# Patient Record
Sex: Female | Born: 2000 | Hispanic: No | Marital: Single | Smoking: Never smoker
Health system: Southern US, Community
[De-identification: ages and names within clinical notes are randomized; demographics above are authoritative.]

## PROBLEM LIST (undated history)

## (undated) DIAGNOSIS — E739 Lactose intolerance, unspecified: Secondary | ICD-10-CM

## (undated) DIAGNOSIS — Z9109 Other allergy status, other than to drugs and biological substances: Secondary | ICD-10-CM

## (undated) DIAGNOSIS — F909 Attention-deficit hyperactivity disorder, unspecified type: Secondary | ICD-10-CM

## (undated) DIAGNOSIS — N390 Urinary tract infection, site not specified: Secondary | ICD-10-CM

## (undated) DIAGNOSIS — J45909 Unspecified asthma, uncomplicated: Secondary | ICD-10-CM

## (undated) HISTORY — PX: TONSILLECTOMY AND ADENOIDECTOMY: SUR1326

## (undated) HISTORY — DX: Urinary tract infection, site not specified: N39.0

## (undated) HISTORY — PX: ADENOIDECTOMY: SUR15

---
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
## 2022-02-01 ENCOUNTER — Other Ambulatory Visit: Payer: Self-pay

## 2022-02-01 ENCOUNTER — Encounter (HOSPITAL_COMMUNITY): Payer: Self-pay | Admitting: Emergency Medicine

## 2022-02-01 ENCOUNTER — Ambulatory Visit (HOSPITAL_COMMUNITY)
Admission: EM | Admit: 2022-02-01 | Discharge: 2022-02-01 | Disposition: A | Discharge status: Home / Self Care | Payer: Medicaid Other | Attending: Nurse Practitioner | Admitting: Nurse Practitioner

## 2022-02-01 DIAGNOSIS — J069 Acute upper respiratory infection, unspecified: Secondary | ICD-10-CM

## 2022-02-01 DIAGNOSIS — Z889 Allergy status to unspecified drugs, medicaments and biological substances status: Secondary | ICD-10-CM | POA: Insufficient documentation

## 2022-02-01 DIAGNOSIS — Z8709 Personal history of other diseases of the respiratory system: Secondary | ICD-10-CM | POA: Diagnosis not present

## 2022-02-01 DIAGNOSIS — Z20822 Contact with and (suspected) exposure to covid-19: Secondary | ICD-10-CM | POA: Diagnosis not present

## 2022-02-01 DIAGNOSIS — R6884 Jaw pain: Secondary | ICD-10-CM | POA: Insufficient documentation

## 2022-02-01 DIAGNOSIS — J029 Acute pharyngitis, unspecified: Secondary | ICD-10-CM | POA: Insufficient documentation

## 2022-02-01 MED ORDER — ALBUTEROL SULFATE HFA 108 (90 BASE) MCG/ACT IN AERS: 1.0000 | INHALATION_SPRAY | Freq: Four times a day (QID) | RESPIRATORY_TRACT | 0 refills | Status: DC | PRN

## 2022-02-01 MED ORDER — CETIRIZINE HCL 10 MG PO TABS: 10.0000 mg | ORAL_TABLET | Freq: Every day | ORAL | 0 refills | Status: DC

## 2022-02-01 MED ORDER — FLUTICASONE PROPIONATE 50 MCG/ACT NA SUSP: 1.0000 | Freq: Every day | NASAL | 0 refills | Status: DC

## 2022-02-01 NOTE — ED Notes (Signed)
Covid swab in lab

## 2022-02-01 NOTE — Discharge Instructions (Addendum)
Please resume the cetirizine 10 mg daily and flonase nasal spray to help with your symptoms.  I have also sent a refill of your rescue inhaler to the pharmacy.  We will call you if the results of the COVID test are positive Monday - please stay home from work until we have these results.   ? ?Some things that can make you feel better are: ?- Increased rest ?- Increasing fluid with water/sugar free electrolytes ?- Acetaminophen as needed for fever/pain.  ?- Salt water gargling, chloraseptic spray and throat lozenges ?- OTC guaifenesin (Mucinex).  ?- Saline sinus flushes or a neti pot.  ?- Humidifying the air. ? ?

## 2022-02-01 NOTE — ED Triage Notes (Signed)
Sore throat started 3 days ago.  Pain has not been that bad until during the night last night and this morning.  Patient has a slight runny nose and a cough. ?

## 2022-02-01 NOTE — ED Provider Notes (Signed)
[facility]    CSN: [id number] Arrival date & time: 02/01/22  1246      History   Chief Complaint Chief Complaint  Janice Nicholson presents with   Sore Throat    HPI Janice Nicholson is a 21 y.o. female.   Janice Nicholson presents with mother for 3 day history of sore throat, congestion, cough, wheezing at times.  Janice Nicholson denies fevers, chills, body aches, shortness of breath, chest pain, headache, and nausea/vomiting, and diarrhea.  Denies ear pain or drainage. Does report nasal drainage and post nasal drip, watery eyes.  Janice Nicholson has not taken any medication so far for the symptoms.  Reports Janice Nicholson co worker is sick.  Also says Janice Nicholson wisdom teeth are coming in and causing Janice Nicholson pain in Janice Nicholson right jaw.    Mother reports Janice Nicholson is trying to get Janice Nicholson in with a new primary care provider.  Janice Nicholson has run out of Janice Nicholson allergy and asthma medication and they are requesting refills today.     Past Medical History:  Diagnosis Date   Asthma    Attention deficit hyperactivity disorder (ADHD)    Environmental allergies    Lactose intolerance     Janice Nicholson Active Problem List   Diagnosis Date Noted   BMI (body mass index), pediatric, 95-99% for age 57/28/2020   Moderate persistent asthma without complication 08/22/2019   Primary insomnia 08/22/2019   ADHD (attention deficit hyperactivity disorder), combined type 03/10/2016   Generalized anxiety disorder 03/10/2016   Adjustment disorder with depressed mood 03/10/2016    Past Surgical History:  Procedure Laterality Date   ADENOIDECTOMY     TONSILLECTOMY AND ADENOIDECTOMY      OB History   No obstetric history on file.      Home Medications    Prior to Admission medications   Medication Sig Start Date End Date Taking? Authorizing Provider  fluticasone (FLONASE) 50 MCG/ACT nasal spray Place 1 spray into both nostrils daily. 02/01/22  Yes Valentino NoseMartinez, August Longest A, NP  albuterol (VENTOLIN HFA) 108 (90 Base) MCG/ACT inhaler Inhale 1 puff into the lungs every 6  (six) hours as needed for wheezing or shortness of breath. 02/01/22   Valentino NoseMartinez, Nashonda Limberg A, NP  Amphetamine Sulfate (EVEKEO ODT) 20 MG TBDP Take 20 mg by mouth daily. Janice Nicholson not taking: Reported on 02/01/2022 10/16/20   Paretta-Leahey, Miachel Rouxawn M, NP  cetirizine (ZYRTEC) 10 MG tablet Take 1 tablet (10 mg total) by mouth daily. 02/01/22 03/03/22  Valentino NoseMartinez, Yuvraj Pfeifer A, NP  cloNIDine (CATAPRES) 0.3 MG tablet TAKE 1 TABLET(0.3 MG) BY MOUTH AT BEDTIME Janice Nicholson not taking: Reported on 02/01/2022 01/02/20   Paretta-Leahey, Miachel Rouxawn M, NP  Fluticasone-Salmeterol (ADVAIR) 250-50 MCG/DOSE AEPB Inhale into the lungs. Janice Nicholson not taking: Reported on 06/15/2020 08/22/19   [provider]  triamcinolone cream (KENALOG) 0.1 % APPLY TO EZCEMA ON ARMS TWICE A DAY Janice Nicholson not taking: Reported on 06/15/2020 03/17/18   [provider]    Family History History reviewed. No pertinent family history.  Social History Social History   Tobacco Use   Smoking status: Never    Passive exposure: Yes   Smokeless tobacco: Never  Vaping Use   Vaping Use: Never used  Substance Use Topics   Alcohol use: Yes   Drug use: Never     Allergies   Lactose intolerance (gi)   Review of Systems Review of Systems Per HPI  Physical Exam Triage Vital Signs ED Triage Vitals  Enc Vitals Group     BP 02/01/22 1307 121/82  Pulse Rate 02/01/22 1307 73     Resp 02/01/22 1307 18     Temp 02/01/22 1307 99.1 F (37.3 C)     Temp Source 02/01/22 1307 Oral     SpO2 02/01/22 1307 98 %     Weight --      Height --      Head Circumference --      Peak Flow --      Pain Score 02/01/22 1302 4     Pain Loc --      Pain Edu? --      Excl. in GC? --    No data found.  Updated Vital Signs BP 121/82 (BP Location: Left Arm)    Pulse 73    Temp 99.1 F (37.3 C) (Oral)    Resp 18    SpO2 98%   Visual Acuity Right Eye Distance:   Left Eye Distance:   Bilateral Distance:    Right Eye Near:   Left Eye Near:     Bilateral Near:     Physical Exam Vitals and nursing note reviewed.  Constitutional:      General: Janice Nicholson is not in acute distress.    Appearance: Janice Nicholson is well-developed. Janice Nicholson is obese. Janice Nicholson is not toxic-appearing.  HENT:     Head: Normocephalic and atraumatic.     Right Ear: Tympanic membrane and ear canal normal. No drainage, swelling or tenderness. No middle ear effusion.     Left Ear: Tympanic membrane and ear canal normal. No drainage, swelling or tenderness.  No middle ear effusion.     Nose: Congestion present. No rhinorrhea.     Mouth/Throat:     Mouth: Mucous membranes are moist.     Pharynx: Uvula midline. Posterior oropharyngeal erythema present. No pharyngeal swelling, oropharyngeal exudate or uvula swelling.     Tonsils: No tonsillar exudate or tonsillar abscesses. 0 on the right. 0 on the left.  Eyes:     Extraocular Movements:     Right eye: Normal extraocular motion.     Left eye: Normal extraocular motion.     Conjunctiva/sclera: Conjunctivae normal.  Cardiovascular:     Rate and Rhythm: Normal rate and regular rhythm.  Pulmonary:     Effort: Pulmonary effort is normal. No respiratory distress.     Breath sounds: Normal breath sounds. No wheezing, rhonchi or rales.  Musculoskeletal:     Cervical back: Normal range of motion and neck supple.  Lymphadenopathy:     Cervical: Cervical adenopathy present.  Skin:    General: Skin is warm and dry.     Capillary Refill: Capillary refill takes less than 2 seconds.     Coloration: Skin is not pale.     Findings: No erythema or rash.  Neurological:     Mental Status: Janice Nicholson is alert and oriented to person, place, and time.     UC Treatments / Results  Labs (all labs ordered are listed, but only abnormal results are displayed) Labs Reviewed  SARS CORONAVIRUS 2 (TAT 6-24 HRS)    EKG   Radiology No results found.  Procedures Procedures (including critical care time)  Medications Ordered in UC Medications - No data  to display  Initial Impression / Assessment and Plan / UC Course  I have reviewed the triage vital signs and the nursing notes.  Pertinent labs & imaging results that were available during my care of the Janice Nicholson were reviewed by me and considered in my medical decision making (see chart  for details).    Obtain COVID-19 testing, we will notify with positive results and Janice Nicholson should remain out of work at least until Monday.  Reassured Janice Nicholson and mother that symptoms and exam findings are most consistent with a viral upper respiratory infection and explained lack of efficacy of antibiotics against viruses.  Discussed expected course and features suggestive of secondary bacterial infection.  Continue supportive care. Increase fluid intake with water or electrolyte solution like pedialyte. Encouraged acetaminophen as needed for fever/pain. Encouraged salt water gargling, chloraseptic spray and throat lozenges. Encouraged OTC guaifenesin. Encouraged saline sinus flushes and/or neti with humidified air.   Refills given for both seasonal allergic rhinitis and history of asthma.  Resume cetirizine 10 mg daily and flonase 1 spray each nostril daily.  Use albuterol 1 puff every 6 hours as needed for wheezing.  No wheezing appreciated on examination today.  Encouraged establishing care with primary care provider for future refills.   Final Clinical Impressions(s) / UC Diagnoses   Final diagnoses:  Acute pharyngitis, unspecified etiology  History of seasonal allergies  History of asthma     Discharge Instructions      Please resume the cetirizine 10 mg daily and flonase nasal spray to help with your symptoms.  I have also sent a refill of your rescue inhaler to the pharmacy.  We will call you if the results of the COVID test are positive Monday - please stay home from work until we have these results.    Some things that can make you feel better are: - Increased rest - Increasing fluid with water/sugar  free electrolytes - Acetaminophen as needed for fever/pain.  - Salt water gargling, chloraseptic spray and throat lozenges - OTC guaifenesin (Mucinex).  - Saline sinus flushes or a neti pot.  - Humidifying the air.    ED Prescriptions     Medication Sig Dispense Auth. Provider   cetirizine (ZYRTEC) 10 MG tablet Take 1 tablet (10 mg total) by mouth daily. 30 tablet Cathlean Marseilles A, NP   albuterol (VENTOLIN HFA) 108 (90 Base) MCG/ACT inhaler Inhale 1 puff into the lungs every 6 (six) hours as needed for wheezing or shortness of breath. 18 g Cathlean Marseilles A, NP   fluticasone (FLONASE) 50 MCG/ACT nasal spray Place 1 spray into both nostrils daily. 16 g Valentino Nose, NP      PDMP not reviewed this encounter.   Valentino Nose, NP 02/01/22 1436

## 2022-02-02 LAB — SARS CORONAVIRUS 2 (TAT 6-24 HRS): SARS Coronavirus 2: NEGATIVE

## 2022-05-12 ENCOUNTER — Ambulatory Visit
Admission: EM | Admit: 2022-05-12 | Discharge: 2022-05-12 | Disposition: A | Discharge status: Home / Self Care | Payer: Medicaid Other | Attending: Internal Medicine | Admitting: Internal Medicine

## 2022-05-12 DIAGNOSIS — J4521 Mild intermittent asthma with (acute) exacerbation: Secondary | ICD-10-CM | POA: Diagnosis present

## 2022-05-12 MED ORDER — ALBUTEROL SULFATE HFA 108 (90 BASE) MCG/ACT IN AERS: 1.0000 | INHALATION_SPRAY | Freq: Four times a day (QID) | RESPIRATORY_TRACT | 0 refills | Status: DC | PRN

## 2022-05-12 MED ORDER — PHENOL 1.4 % MT LIQD: 1.0000 | OROMUCOSAL | 0 refills | Status: DC | PRN
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
## 2022-05-12 NOTE — Discharge Instructions (Addendum)
Salt water gargle Chloraseptic throat spray as needed for throat pain Use medications as prescribed Return to urgent care if symptoms worsen We will call you with recommendations if labs are abnormal.

## 2022-05-12 NOTE — ED Triage Notes (Signed)
Pt c/o numbness in right arm states right hand has been numb x 1 week. States it may be associated w/ her wisdom teeth. Something prompted patient to place fingers in throat, states can feel a bump.   Needs inhaler refill.

## 2022-05-13 LAB — CYTOLOGY, (ORAL, ANAL, URETHRAL) ANCILLARY ONLY
Chlamydia: NEGATIVE
Comment: NEGATIVE
Comment: NEGATIVE
Comment: NORMAL
Neisseria Gonorrhea: POSITIVE — AB
Trichomonas: NEGATIVE

## 2022-05-14 ENCOUNTER — Ambulatory Visit
Admission: EM | Admit: 2022-05-14 | Discharge: 2022-05-14 | Disposition: A | Discharge status: Home / Self Care | Payer: Medicaid Other

## 2022-05-14 ENCOUNTER — Ambulatory Visit
Admission: EM | Admit: 2022-05-14 | Discharge: 2022-05-14 | Disposition: A | Discharge status: Home / Self Care | Payer: Medicaid Other | Attending: Internal Medicine | Admitting: Internal Medicine

## 2022-05-14 ENCOUNTER — Telehealth (HOSPITAL_COMMUNITY): Payer: Self-pay | Admitting: Emergency Medicine

## 2022-05-14 DIAGNOSIS — A549 Gonococcal infection, unspecified: Secondary | ICD-10-CM | POA: Diagnosis not present

## 2022-05-14 MED ORDER — CEFTRIAXONE SODIUM 500 MG IJ SOLR
500.0000 mg | Freq: Once | INTRAMUSCULAR | Status: AC
  Administered 2022-05-14: 500 mg via INTRAMUSCULAR

## 2022-05-14 NOTE — Telephone Encounter (Signed)
PEr protocol, patient will need treatment with IM ROcephin 500mg  for positive Gonorrhea of the throat.  WIll also need to return for test of cure in 1 week.   Contacted patient by phone.  Verified identity using two identifiers.  Provided positive result.  Reviewed safe sex practices, notifying partners, and refraining from sexual activities for 7 days from time of treatment.  Patient verified understanding, all questions answered.   [facility] notified

## 2022-05-14 NOTE — ED Notes (Signed)
Staff went to call patient name in the lobby and she did not answer, staff called patient Via phone and patient stated she had left and it was fine.

## 2022-05-14 NOTE — ED Triage Notes (Signed)
Pt came in to be treated for positive Gonorrhea.

## 2022-05-16 ENCOUNTER — Ambulatory Visit
Admission: EM | Admit: 2022-05-16 | Discharge: 2022-05-16 | Disposition: A | Discharge status: Home / Self Care | Payer: Medicaid Other | Attending: Urgent Care | Admitting: Urgent Care

## 2022-05-16 DIAGNOSIS — Z202 Contact with and (suspected) exposure to infections with a predominantly sexual mode of transmission: Secondary | ICD-10-CM | POA: Insufficient documentation

## 2022-05-16 LAB — POCT URINE PREGNANCY: Preg Test, Ur: NEGATIVE

## 2022-05-16 MED ORDER — METRONIDAZOLE 500 MG PO TABS: 500.0000 mg | ORAL_TABLET | Freq: Two times a day (BID) | ORAL | 0 refills | Status: AC

## 2022-05-16 NOTE — ED Triage Notes (Signed)
Pt presents today for treatment of tric. Pt reports she came in for sore throat which was gonorrhea. Pt reports she received shot for this but so also tested pos for tric. Pt reports unknown last known period. Pt also reports numbness in right up non stop for 3 days.

## 2022-05-19 LAB — MOLECULAR ANCILLARY ONLY
Bacterial Vaginitis (gardnerella): NEGATIVE
Candida Glabrata: NEGATIVE
Candida Vaginitis: NEGATIVE
Chlamydia: NEGATIVE
Comment: NEGATIVE
Comment: NEGATIVE
Comment: NEGATIVE
Comment: NEGATIVE
Comment: NEGATIVE
Comment: NORMAL
Neisseria Gonorrhea: NEGATIVE
Trichomonas: NEGATIVE

## 2022-07-21 ENCOUNTER — Ambulatory Visit
Admission: EM | Admit: 2022-07-21 | Discharge: 2022-07-21 | Disposition: A | Discharge status: Home / Self Care | Payer: Medicaid Other | Attending: Physician Assistant | Admitting: Physician Assistant

## 2022-07-21 DIAGNOSIS — R1012 Left upper quadrant pain: Secondary | ICD-10-CM

## 2022-07-21 DIAGNOSIS — R1013 Epigastric pain: Secondary | ICD-10-CM

## 2022-07-21 LAB — POCT URINALYSIS DIP (MANUAL ENTRY)
Bilirubin, UA: NEGATIVE
Blood, UA: NEGATIVE
Glucose, UA: NEGATIVE mg/dL
Ketones, POC UA: NEGATIVE mg/dL
Leukocytes, UA: NEGATIVE
Nitrite, UA: NEGATIVE
Spec Grav, UA: 1.025 (ref 1.010–1.025)
Urobilinogen, UA: 0.2 E.U./dL
pH, UA: 7.5 (ref 5.0–8.0)

## 2022-07-21 LAB — POCT URINE PREGNANCY: Preg Test, Ur: NEGATIVE

## 2022-07-21 MED ORDER — DICYCLOMINE HCL 20 MG PO TABS: 20.0000 mg | ORAL_TABLET | Freq: Two times a day (BID) | ORAL | 0 refills | Status: DC

## 2022-07-21 NOTE — ED Provider Notes (Signed)
[facility]    CSN: [id number] Arrival date & time: 07/21/22  1242      History   Chief Complaint Chief Complaint  Patient presents with   left back pain    HPI Janice Nicholson is a 21 y.o. female.   Patient here today for evaluation of epigastric abdominal pain/left upper quadrant abdominal pain that radiates to her back.  She reports that this has been ongoing but seems to be worse recently.  Eating certain foods seems to worsen pain but she has not been able to determine what triggers more intense pain.  She has had some associated nausea, vomiting and diarrhea.  She reports some "heat strokes" while she is vomiting at times.  She denies any known fever.  She denies any blood in her stool or dark tarry stools.  She does not report treatment for symptoms.  The history is provided by the patient.    Past Medical History:  Diagnosis Date   Asthma    Attention deficit hyperactivity disorder (ADHD)    Environmental allergies    Lactose intolerance     Patient Active Problem List   Diagnosis Date Noted   BMI (body mass index), pediatric, 95-99% for age 48/28/2020   Moderate persistent asthma without complication 08/22/2019   Primary insomnia 08/22/2019   ADHD (attention deficit hyperactivity disorder), combined type 03/10/2016   Generalized anxiety disorder 03/10/2016   Adjustment disorder with depressed mood 03/10/2016    Past Surgical History:  Procedure Laterality Date   ADENOIDECTOMY     TONSILLECTOMY AND ADENOIDECTOMY      OB History   No obstetric history on file.      Home Medications    Prior to Admission medications   Medication Sig Start Date End Date Taking? Authorizing Provider  dicyclomine (BENTYL) 20 MG tablet Take 1 tablet (20 mg total) by mouth 2 (two) times daily. 07/21/22  Yes Tomi Bamberger, PA-C  albuterol (VENTOLIN HFA) 108 (90 Base) MCG/ACT inhaler Inhale 1 puff into the lungs every 6 (six) hours as needed for wheezing or  shortness of breath. 05/12/22   Lamptey, Britta Mccreedy, MD  cetirizine (ZYRTEC) 10 MG tablet Take 1 tablet (10 mg total) by mouth daily. 02/01/22 03/03/22  Valentino Nose, NP  fluticasone (FLONASE) 50 MCG/ACT nasal spray Place 1 spray into both nostrils daily. 02/01/22   Valentino Nose, NP  phenol (CHLORASEPTIC) 1.4 % LIQD Use as directed 1 spray in the mouth or throat as needed for throat irritation / pain. 05/12/22   Lamptey, Britta Mccreedy, MD    Family History History reviewed. No pertinent family history.  Social History Social History   Tobacco Use   Smoking status: Never    Passive exposure: Yes   Smokeless tobacco: Never  Vaping Use   Vaping Use: Never used  Substance Use Topics   Alcohol use: Yes   Drug use: Never     Allergies   Lactose intolerance (gi)   Review of Systems Review of Systems  Constitutional:  Negative for chills and fever.  Eyes:  Negative for discharge and redness.  Respiratory:  Negative for shortness of breath.   Gastrointestinal:  Positive for abdominal pain, diarrhea, nausea and vomiting. Negative for blood in stool.     Physical Exam Triage Vital Signs ED Triage Vitals  Enc Vitals Group     BP 07/21/22 1455 135/85     Pulse Rate 07/21/22 1455 83     Resp 07/21/22 1455 18  Temp 07/21/22 1455 98.3 F (36.8 C)     Temp Source 07/21/22 1455 Oral     SpO2 07/21/22 1455 99 %     Weight --      Height --      Head Circumference --      Peak Flow --      Pain Score 07/21/22 1456 8     Pain Loc --      Pain Edu? --      Excl. in GC? --    No data found.  Updated Vital Signs BP 135/85 (BP Location: Right Arm)   Pulse 83   Temp 98.3 F (36.8 C) (Oral)   Resp 18   LMP 06/07/2022 (Approximate)   SpO2 99%      Physical Exam Vitals and nursing note reviewed.  Constitutional:      General: She is not in acute distress.    Appearance: Normal appearance. She is not ill-appearing.  HENT:     Head: Normocephalic and atraumatic.   Eyes:     Conjunctiva/sclera: Conjunctivae normal.  Cardiovascular:     Rate and Rhythm: Normal rate and regular rhythm.     Heart sounds: Normal heart sounds.  Pulmonary:     Effort: Pulmonary effort is normal. No respiratory distress.     Breath sounds: Normal breath sounds. No wheezing, rhonchi or rales.  Abdominal:     General: Abdomen is flat. Bowel sounds are normal. There is no distension.     Tenderness: There is abdominal tenderness (mild epigastric and LUQ TTP). There is no guarding or rebound.  Neurological:     Mental Status: She is alert.  Psychiatric:        Mood and Affect: Mood normal.        Behavior: Behavior normal.        Thought Content: Thought content normal.      UC Treatments / Results  Labs (all labs ordered are listed, but only abnormal results are displayed) Labs Reviewed  POCT URINALYSIS DIP (MANUAL ENTRY) - Abnormal; Notable for the following components:      Result Value   Color, UA orange (*)    Protein Ur, POC trace (*)    All other components within normal limits  CBC WITH DIFFERENTIAL/PLATELET  COMPREHENSIVE METABOLIC PANEL  AMYLASE  LIPASE  H. PYLORI ANTIBODY, IGG  POCT URINE PREGNANCY    EKG   Radiology No results found.  Procedures Procedures (including critical care time)  Medications Ordered in UC Medications - No data to display  Initial Impression / Assessment and Plan / UC Course  I have reviewed the triage vital signs and the nursing notes.  Pertinent labs & imaging results that were available during my care of the patient were reviewed by me and considered in my medical decision making (see chart for details).    Routine labs ordered.  Differential includes H. pylori, gastric ulcers, pancreatitis.  Low suspicion for kidney stone given UA results.  Recommended further evaluation in the emergency room with any further concerns and discussed that she may need to see gastroenterologist for further evaluation as well.   Will trial dicyclomine for abdominal pain while awaiting results.  Final Clinical Impressions(s) / UC Diagnoses   Final diagnoses:  LUQ pain  Abdominal pain, epigastric   Discharge Instructions   None    ED Prescriptions     Medication Sig Dispense Auth. Provider   dicyclomine (BENTYL) 20 MG tablet Take 1 tablet (20 mg  total) by mouth 2 (two) times daily. 20 tablet Tomi Bamberger, PA-C      PDMP not reviewed this encounter.   Tomi Bamberger, PA-C 07/21/22 [phone]

## 2022-07-21 NOTE — ED Triage Notes (Signed)
Pt c/o sharp left back pain onset ~ 1 week ago. Associates it with something she was eating that caused her to vomit. Reports pain radiates around left side to left stomach.

## 2022-07-22 ENCOUNTER — Other Ambulatory Visit: Payer: Self-pay

## 2022-07-22 ENCOUNTER — Telehealth: Payer: Self-pay | Admitting: Physician Assistant

## 2022-07-22 ENCOUNTER — Emergency Department (HOSPITAL_COMMUNITY): Payer: Medicaid Other

## 2022-07-22 ENCOUNTER — Encounter (HOSPITAL_COMMUNITY): Payer: Self-pay

## 2022-07-22 ENCOUNTER — Inpatient Hospital Stay (HOSPITAL_COMMUNITY)
Admission: EM | Admit: 2022-07-22 | Discharge: 2022-07-25 | DRG: 418 | Disposition: A | Discharge status: Home / Self Care | Payer: Medicaid Other | Attending: Surgery | Admitting: Surgery

## 2022-07-22 DIAGNOSIS — K851 Biliary acute pancreatitis without necrosis or infection: Secondary | ICD-10-CM

## 2022-07-22 DIAGNOSIS — Z888 Allergy status to other drugs, medicaments and biological substances status: Secondary | ICD-10-CM

## 2022-07-22 DIAGNOSIS — E739 Lactose intolerance, unspecified: Secondary | ICD-10-CM | POA: Diagnosis present

## 2022-07-22 DIAGNOSIS — Z91012 Allergy to eggs: Secondary | ICD-10-CM

## 2022-07-22 DIAGNOSIS — Z6838 Body mass index (BMI) 38.0-38.9, adult: Secondary | ICD-10-CM

## 2022-07-22 DIAGNOSIS — Z79899 Other long term (current) drug therapy: Secondary | ICD-10-CM

## 2022-07-22 DIAGNOSIS — K8010 Calculus of gallbladder with chronic cholecystitis without obstruction: Secondary | ICD-10-CM | POA: Diagnosis present

## 2022-07-22 DIAGNOSIS — K8020 Calculus of gallbladder without cholecystitis without obstruction: Secondary | ICD-10-CM | POA: Diagnosis present

## 2022-07-22 DIAGNOSIS — F909 Attention-deficit hyperactivity disorder, unspecified type: Secondary | ICD-10-CM | POA: Diagnosis present

## 2022-07-22 DIAGNOSIS — R1010 Upper abdominal pain, unspecified: Principal | ICD-10-CM

## 2022-07-22 DIAGNOSIS — Z880 Allergy status to penicillin: Secondary | ICD-10-CM

## 2022-07-22 DIAGNOSIS — J45909 Unspecified asthma, uncomplicated: Secondary | ICD-10-CM | POA: Diagnosis present

## 2022-07-22 DIAGNOSIS — E669 Obesity, unspecified: Secondary | ICD-10-CM | POA: Diagnosis present

## 2022-07-22 HISTORY — DX: Biliary acute pancreatitis without necrosis or infection: K85.10

## 2022-07-22 LAB — COMPREHENSIVE METABOLIC PANEL
ALT: 40 IU/L — ABNORMAL HIGH (ref 0–32)
ALT: 34 U/L (ref 0–44)
AST: 126 IU/L — ABNORMAL HIGH (ref 0–40)
AST: 40 U/L (ref 15–41)
Albumin/Globulin Ratio: 1.5 (ref 1.2–2.2)
Albumin: 4.3 g/dL (ref 4.0–5.0)
Albumin: 4.1 g/dL (ref 3.5–5.0)
Alkaline Phosphatase: 109 IU/L — ABNORMAL HIGH (ref 42–106)
Alkaline Phosphatase: 85 U/L (ref 38–126)
Anion gap: 6 (ref 5–15)
BUN/Creatinine Ratio: 15 (ref 9–23)
BUN: 10 mg/dL (ref 6–20)
BUN: 9 mg/dL (ref 6–20)
Bilirubin Total: 0.5 mg/dL (ref 0.0–1.2)
CO2: 22 mmol/L (ref 20–29)
CO2: 26 mmol/L (ref 22–32)
Calcium: 9.5 mg/dL (ref 8.7–10.2)
Calcium: 9.2 mg/dL (ref 8.9–10.3)
Chloride: 103 mmol/L (ref 96–106)
Chloride: 106 mmol/L (ref 98–111)
Creatinine, Ser: 0.65 mg/dL (ref 0.57–1.00)
Creatinine, Ser: 0.53 mg/dL (ref 0.44–1.00)
GFR, Estimated: >60 mL/min (ref >60)
Globulin, Total: 2.9 g/dL (ref 1.5–4.5)
Glucose, Bld: 96 mg/dL (ref 70–99)
Glucose: 88 mg/dL (ref 70–99)
Potassium: 4.3 mmol/L (ref 3.5–5.2)
Potassium: 3.9 mmol/L (ref 3.5–5.1)
Sodium: 138 mmol/L (ref 134–144)
Sodium: 138 mmol/L (ref 135–145)
Total Bilirubin: 0.5 mg/dL (ref 0.3–1.2)
Total Protein: 7.2 g/dL (ref 6.0–8.5)
Total Protein: 7.7 g/dL (ref 6.5–8.1)
eGFR: 131 mL/min/{1.73_m2} (ref >59)

## 2022-07-22 LAB — URINALYSIS, ROUTINE W REFLEX MICROSCOPIC
Bilirubin Urine: NEGATIVE
Glucose, UA: NEGATIVE mg/dL
Hgb urine dipstick: NEGATIVE
Ketones, ur: NEGATIVE mg/dL
Leukocytes,Ua: NEGATIVE
Nitrite: NEGATIVE
Protein, ur: NEGATIVE mg/dL
Specific Gravity, Urine: 1.02 (ref 1.005–1.030)
pH: 5 (ref 5.0–8.0)

## 2022-07-22 LAB — CBC WITH DIFFERENTIAL/PLATELET
Abs Immature Granulocytes: 0.06 10*3/uL (ref 0.00–0.07)
Basophils Absolute: 0.1 10*3/uL (ref 0.0–0.2)
Basophils Absolute: 0.1 10*3/uL (ref 0.0–0.1)
Basophils Relative: 1 %
Basos: 0 %
EOS (ABSOLUTE): 0 10*3/uL (ref 0.0–0.4)
Eos: 0 %
Eosinophils Absolute: 0.1 10*3/uL (ref 0.0–0.5)
Eosinophils Relative: 1 %
HCT: 41.1 % (ref 36.0–46.0)
Hematocrit: 38.6 % (ref 34.0–46.6)
Hemoglobin: 12.6 g/dL (ref 11.1–15.9)
Hemoglobin: 13 g/dL (ref 12.0–15.0)
Immature Grans (Abs): 0 10*3/uL (ref 0.0–0.1)
Immature Granulocytes: 0 %
Immature Granulocytes: 1 %
Lymphocytes Absolute: 3.1 10*3/uL (ref 0.7–3.1)
Lymphocytes Relative: 36 %
Lymphs Abs: 3.8 10*3/uL (ref 0.7–4.0)
Lymphs: 22 %
MCH: 26.1 pg — ABNORMAL LOW (ref 26.6–33.0)
MCH: 26.1 pg (ref 26.0–34.0)
MCHC: 32.6 g/dL (ref 31.5–35.7)
MCHC: 31.6 g/dL (ref 30.0–36.0)
MCV: 80 fL (ref 79–97)
MCV: 82.5 fL (ref 80.0–100.0)
Monocytes Absolute: 1.1 10*3/uL — ABNORMAL HIGH (ref 0.1–0.9)
Monocytes Absolute: 0.8 10*3/uL (ref 0.1–1.0)
Monocytes Relative: 7 %
Monocytes: 8 %
Neutro Abs: 5.9 10*3/uL (ref 1.7–7.7)
Neutrophils Absolute: 9.8 10*3/uL — ABNORMAL HIGH (ref 1.4–7.0)
Neutrophils Relative %: 54 %
Neutrophils: 70 %
Platelets: 441 10*3/uL (ref 150–450)
Platelets: 425 10*3/uL — ABNORMAL HIGH (ref 150–400)
RBC: 4.82 x10E6/uL (ref 3.77–5.28)
RBC: 4.98 MIL/uL (ref 3.87–5.11)
RDW: 13.8 % (ref 11.7–15.4)
RDW: 13.8 % (ref 11.5–15.5)
WBC: 14.1 10*3/uL — ABNORMAL HIGH (ref 3.4–10.8)
WBC: 10.6 10*3/uL — ABNORMAL HIGH (ref 4.0–10.5)
nRBC: 0 % (ref 0.0–0.2)

## 2022-07-22 LAB — LIPASE, BLOOD: Lipase: 50 U/L (ref 11–51)

## 2022-07-22 LAB — H. PYLORI ANTIBODY, IGG: H. pylori, IgG AbS: 0.4 Index Value (ref 0.00–0.79)

## 2022-07-22 LAB — LIPASE: Lipase: 3384 U/L — ABNORMAL HIGH (ref 14–72)

## 2022-07-22 LAB — TRIGLYCERIDES: Triglycerides: 92 mg/dL (ref <150)

## 2022-07-22 LAB — AMYLASE: Amylase: 574 U/L (ref 31–110)

## 2022-07-22 MED ORDER — MELATONIN 3 MG PO TABS
3.0000 mg | ORAL_TABLET | Freq: Every evening | ORAL | Status: DC | PRN
  Administered 2022-07-24: 3 mg via ORAL
  Filled 2022-07-22: qty 1

## 2022-07-22 MED ORDER — DIPHENHYDRAMINE HCL 12.5 MG/5ML PO ELIX
12.5000 mg | ORAL_SOLUTION | Freq: Four times a day (QID) | ORAL | Status: DC | PRN
  Administered 2022-07-23 (×2): 12.5 mg via ORAL
  Filled 2022-07-22 (×2): qty 5

## 2022-07-22 MED ORDER — ONDANSETRON HCL 4 MG/2ML IJ SOLN
4.0000 mg | Freq: Four times a day (QID) | INTRAMUSCULAR | Status: DC | PRN
  Administered 2022-07-25: 4 mg via INTRAVENOUS
  Filled 2022-07-22: qty 2

## 2022-07-22 MED ORDER — IBUPROFEN 400 MG PO TABS: 600.0000 mg | ORAL_TABLET | Freq: Four times a day (QID) | ORAL | Status: DC | PRN

## 2022-07-22 MED ORDER — METHOCARBAMOL 1000 MG/10ML IJ SOLN: 500.0000 mg | Freq: Three times a day (TID) | INTRAVENOUS | Status: DC | PRN

## 2022-07-22 MED ORDER — MORPHINE SULFATE (PF) 2 MG/ML IV SOLN
2.0000 mg | Freq: Once | INTRAVENOUS | Status: AC
  Administered 2022-07-23: 2 mg via INTRAVENOUS
  Filled 2022-07-22: qty 1

## 2022-07-22 MED ORDER — OXYCODONE HCL 5 MG PO TABS: 5.0000 mg | ORAL_TABLET | ORAL | Status: DC | PRN

## 2022-07-22 MED ORDER — ONDANSETRON 4 MG PO TBDP
4.0000 mg | ORAL_TABLET | Freq: Four times a day (QID) | ORAL | Status: DC | PRN
  Administered 2022-07-23: 4 mg via ORAL
  Filled 2022-07-22: qty 1

## 2022-07-22 MED ORDER — MORPHINE SULFATE (PF) 2 MG/ML IV SOLN
1.0000 mg | INTRAVENOUS | Status: DC | PRN
  Administered 2022-07-23 (×3): 2 mg via INTRAVENOUS
  Filled 2022-07-22 (×3): qty 1

## 2022-07-22 MED ORDER — DEXTROSE IN LACTATED RINGERS 5 % IV SOLN: INTRAVENOUS | Status: DC

## 2022-07-22 MED ORDER — CIPROFLOXACIN IN D5W 400 MG/200ML IV SOLN
400.0000 mg | Freq: Two times a day (BID) | INTRAVENOUS | Status: DC
  Administered 2022-07-23 (×2): 400 mg via INTRAVENOUS
  Filled 2022-07-22 (×2): qty 200

## 2022-07-22 MED ORDER — PROCHLORPERAZINE MALEATE 10 MG PO TABS: 10.0000 mg | ORAL_TABLET | Freq: Four times a day (QID) | ORAL | Status: DC | PRN

## 2022-07-22 MED ORDER — ACETAMINOPHEN 325 MG PO TABS: 325.0000 mg | ORAL_TABLET | Freq: Four times a day (QID) | ORAL | Status: DC | PRN

## 2022-07-22 MED ORDER — METHOCARBAMOL 500 MG PO TABS: 500.0000 mg | ORAL_TABLET | Freq: Three times a day (TID) | ORAL | Status: DC | PRN

## 2022-07-22 MED ORDER — LORATADINE 10 MG PO TABS
10.0000 mg | ORAL_TABLET | Freq: Every day | ORAL | Status: DC
  Administered 2022-07-24 – 2022-07-25 (×2): 10 mg via ORAL
  Filled 2022-07-22 (×2): qty 1

## 2022-07-22 MED ORDER — PROCHLORPERAZINE EDISYLATE 10 MG/2ML IJ SOLN
5.0000 mg | Freq: Four times a day (QID) | INTRAMUSCULAR | Status: DC | PRN
  Administered 2022-07-23: 5 mg via INTRAVENOUS
  Filled 2022-07-22: qty 2

## 2022-07-22 MED ORDER — ALBUTEROL SULFATE HFA 108 (90 BASE) MCG/ACT IN AERS
1.0000 | INHALATION_SPRAY | Freq: Four times a day (QID) | RESPIRATORY_TRACT | Status: DC | PRN
Start: 2022-07-22 — End: 2022-07-22

## 2022-07-22 MED ORDER — ALBUTEROL SULFATE (2.5 MG/3ML) 0.083% IN NEBU: 2.5000 mg | INHALATION_SOLUTION | Freq: Four times a day (QID) | RESPIRATORY_TRACT | Status: DC | PRN

## 2022-07-22 MED ORDER — DIPHENHYDRAMINE HCL 50 MG/ML IJ SOLN: 12.5000 mg | Freq: Four times a day (QID) | INTRAMUSCULAR | Status: DC | PRN

## 2022-07-22 MED ORDER — ENOXAPARIN SODIUM 40 MG/0.4ML IJ SOSY
40.0000 mg | PREFILLED_SYRINGE | INTRAMUSCULAR | Status: DC
Start: 2022-07-23 — End: 2022-07-23
  Administered 2022-07-23: 40 mg via SUBCUTANEOUS
  Filled 2022-07-22: qty 0.4

## 2022-07-22 NOTE — ED Provider Notes (Signed)
[facility]-EMERGENCY DEPT Provider Note   CSN: [id number] Arrival date & time: 07/22/22  1557     History  Chief Complaint  Patient presents with   Abdominal Pain    Janice Nicholson is a 21 y.o. female.  HPI Presents 1 day after being seen in urgent care, having been notified that her labs were abnormal, now with concern for ongoing back pain, left upper quadrant abdominal pain.  No anorexia, last bowel movement was earlier today, she does note that after eating she has been having episodes of vomiting and diarrhea.  She has no history of abdominal surgery, last menstrual period was about 1 month ago.    After 1 week of mild back pain, she had 1 subsequent week of more severe pain, in the distribution above, with new associated anorexia, nausea.  Symptoms are somewhat better than they were yesterday.  Patient here with her mother who assists with the history as well    Home Medications Prior to Admission medications   Medication Sig Start Date End Date Taking? Authorizing Provider  acetaminophen (TYLENOL) 325 MG tablet Take 325-650 mg by mouth every 6 (six) hours as needed for mild pain, headache or fever.   Yes [provider]  cetirizine (ZYRTEC) 10 MG tablet Take 1 tablet (10 mg total) by mouth daily. Patient taking differently: Take 10 mg by mouth daily as needed for allergies or rhinitis. 02/01/22 07/22/22 Yes Valentino Nose, NP  dicyclomine (BENTYL) 20 MG tablet Take 1 tablet (20 mg total) by mouth 2 (two) times daily. 07/21/22  Yes Tomi Bamberger, PA-C  phenol (CHLORASEPTIC) 1.4 % LIQD Use as directed 1 spray in the mouth or throat as needed for throat irritation / pain. 05/12/22  Yes Lamptey, Britta Mccreedy, MD  albuterol (VENTOLIN HFA) 108 (90 Base) MCG/ACT inhaler Inhale 1 puff into the lungs every 6 (six) hours as needed for wheezing or shortness of breath. Patient not taking: Reported on 07/22/2022 05/12/22   Merrilee Jansky, MD  fluticasone  [facility]) 50 MCG/ACT nasal spray Place 1 spray into both nostrils daily. Patient not taking: Reported on 07/22/2022 02/01/22   Valentino Nose, NP  Investigational - Study Medication Inhale 2 puffs into the lungs See admin instructions. Hailie study inhalers "AV007- Batura" [phone] or [phone]) Inhale 2 puffs into the lungs every 4 hours as needed for wheezing or shortness of breath Additional study details: Study started in early 06/2022 and will last through approx 08/2022. Med ID [id number]; Batch [id number] Patient not taking: Reported on 07/22/2022    [provider]      Allergies    Lactose intolerance (gi), Eggs or egg-derived products, Molds & smuts, and Penicillins    Review of Systems   Review of Systems  All other systems reviewed and are negative.   Physical Exam Updated Vital Signs BP 120/65   Pulse 67   Temp (!) 97.4 F (36.3 C) (Oral)   Resp 20   LMP 06/07/2022 (Approximate)   SpO2 100%  Physical Exam Vitals and nursing note reviewed.  Constitutional:      General: She is not in acute distress.    Appearance: She is well-developed. She is obese. She is not ill-appearing, toxic-appearing or diaphoretic.  HENT:     Head: Normocephalic and atraumatic.  Eyes:     Conjunctiva/sclera: Conjunctivae normal.  Cardiovascular:     Rate and Rhythm: Normal rate and regular rhythm.  Pulmonary:     Effort: Pulmonary effort  is normal. No respiratory distress.     Breath sounds: Normal breath sounds. No stridor.  Abdominal:     General: There is no distension.     Tenderness: There is abdominal tenderness in the epigastric area and left upper quadrant. Negative signs include Murphy's sign.  Skin:    General: Skin is warm and dry.  Neurological:     Mental Status: She is alert and oriented to person, place, and time.     Cranial Nerves: No cranial nerve deficit.  Psychiatric:        Mood and Affect: Mood normal.     ED Results / Procedures / Treatments    Labs (all labs ordered are listed, but only abnormal results are displayed) Labs Reviewed  CBC WITH DIFFERENTIAL/PLATELET - Abnormal; Notable for the following components:      Result Value   WBC 10.6 (*)    Platelets 425 (*)    All other components within normal limits  COMPREHENSIVE METABOLIC PANEL  LIPASE, BLOOD  URINALYSIS, ROUTINE W REFLEX MICROSCOPIC  TRIGLYCERIDES    EKG None  Radiology US Abdomen Limited RUQ (LIVER/GB)  Result Date: 07/22/2022 CLINICAL DATA:  Right upper quadrant abdominal pain since 2 days. Labs consistent with pancreatitis, possible hepatic dysfunction EXAM: ULTRASOUND ABDOMEN LIMITED RIGHT UPPER QUADRANT COMPARISON:  None Available. FINDINGS: Gallbladder: There are multiple gallstones seen with the largest gallstone measuring 1.6 cm. There is no gallbladder wall thickening seen. Gallbladder wall measures 2 mm. No pericholecystic fluid. No sonographic Murphy's sign per the sonographer note. Common bile duct: Diameter: 4.1 mm Liver: No focal lesion identified. Within normal limits in parenchymal echogenicity. Portal vein is patent on color Doppler imaging with normal direction of blood flow towards the liver. Other: None. IMPRESSION: Cholelithiasis without ultrasonographic evidence of cholecystitis. CBD is within normal limits. Liver has a normal echo pattern. Electronically Signed   By: Marjo Bicker M.D.   On: 07/22/2022 18:40    Procedures Procedures    Medications Ordered in ED Medications - No data to display  ED Course/ Medical Decision Making/ A&P This patient with a Hx of ADHD, obesity, asthma presents to the ED for concern of back and left upper quadrant pain with abnormal labs outside hospital, this involves an extensive number of treatment options, and is a complaint that carries with it a high risk of complications and morbidity.    The differential diagnosis includes gastritis, esophagitis, hepatobiliary dysfunction given lab abnormalities,  less likely pancreatitis   Social Determinants of Health:  No limits  Additional history obtained:  Additional history and/or information obtained from mom , notable for details included above   After the initial evaluation, orders, including: [place], labs were initiated.   Patient placed on Cardiac and Pulse-Oximetry Monitors. The patient was maintained on a cardiac monitor.  The cardiac monitored showed an rhythm of 70 sinus normal The patient was also maintained on pulse oximetry. The readings were typically percent room air normal   On repeat evaluation of the patient improved  Lab Tests:  I personally interpreted labs.  The pertinent results include: In comparison to yesterday substantial contrast.  Whereas yesterday the patient's lipase value was greater than 3000 that it is 50.  Yesterday's abnormalities throughout liver function tests have all normalized, leukocytosis has improved  Imaging Studies ordered:  I independently visualized and interpreted imaging which showed stones, largest 1.6 on ultrasound I agree with the radiologist interpretation  Consultations Obtained:  I requested consultation with the surgery,  and  discussed lab and imaging findings as well as pertinent plan - they recommend: Cholecystectomy  Dispostion / Final MDM:  After consideration of the diagnostic results and the patient's response to treatment, Young female awake, alert, presents with 1 week of nausea, vomiting, with symptoms that have slightly improved today compared to yesterday, but after alarming lab values yesterday.  Here patient is awake alert, improves, to the point where her pain is gone, but with concern for likely gallstone pancreatitis, yesterday, with persistent gallstones I discussed her case with her surgical colleague.  Final Clinical Impression(s) / ED Diagnoses Final diagnoses:  Pain of upper abdomen  Gallstones     Gerhard Munch, MD 07/22/22 2200

## 2022-07-22 NOTE — ED Notes (Signed)
Needs IV @ 2351

## 2022-07-22 NOTE — Telephone Encounter (Signed)
Notified patient (and mother at patient request) of elevated pancreatic enzymes- recommended further evaluation in the ED immediately.

## 2022-07-22 NOTE — ED Provider Triage Note (Signed)
Emergency Medicine Provider Triage Evaluation Note  Janice Nicholson , a 21 y.o. female  was evaluated in triage.  Pt complains of possible pancreatitis.  She was evaluated yesterday in urgent care for burning epigastric pain which also affects the left side of her abdomen.  She also endorses nausea and occasional episodes of emesis.  Lipase drawn yesterday was elevated at greater than 70 times upper limit amylase was also significantly elevated.  The patient states that on weekends she does occasionally drink half a bottle of liquor but did not drink this past weekend.  She also endorses consuming a diet high in fats with a significant amount of spicy food.  Denies chest pain, shortness of breath  Review of Systems  Positive: As above Negative: As above  Physical Exam  BP (!) 135/91 (BP Location: Left Arm)   Pulse 83   Temp 98.5 F (36.9 C) (Oral)   Resp 16   LMP 06/07/2022 (Approximate)   SpO2 99%  Gen:   Awake, no distress   Resp:  Normal effort  MSK:   Moves extremities without difficulty  Other:    Medical Decision Making  Medically screening exam initiated at 4:20 PM.  Appropriate orders placed.  [name] was informed that the remainder of the evaluation will be completed by another provider, this initial triage assessment does not replace that evaluation, and the importance of remaining in the ED until their evaluation is complete.     Darrick Grinder, PA-C 07/22/22 1622

## 2022-07-22 NOTE — H&P (Signed)
Janice Nicholson is an 21 y.o. female.   Chief Complaint: abdominal pain HPI:  Pt presented to the ED this afternoon with elevated LFTs and lipase.  She went to urgent care yesterday with epigastric and mid abdominal pain.  This radiated to her back.  She had some n/v and dry heaves.   She denied jaundice.  She hasn't had sick contacts.  She has been having abdominal pain for around 1 month.  It was more intermittent and milder, but it started worsening around 2 weeks ago.  She stated that it seemed to wrap around her like a band.  It seemed to be worse with some foods, but she wasn't able to really correlate which ones.    Her labs came back today with elevated lipase of 3384 so she was told to go to the ED.  LFTs and lipase also normalized.  She has continued to have unresolved pain.  She has tried OTC pain meds, positioning, cold shower, hot shower, eating, not eating, having a BM, throwing up, but nothing has resolved pain.  She denies eye color change, but describes her urine as being orange for around a week.  RUQ u/s performed today and this showed cholelithiasis with largest stone being 1.6 cm.  No pericholecystic fluid or wall thickening.    Multiple relatives ("everyone in our family") have required cholecystectomy  Past Medical History:  Diagnosis Date   Asthma    Attention deficit hyperactivity disorder (ADHD)    Environmental allergies    Lactose intolerance     Past Surgical History:  Procedure Laterality Date   ADENOIDECTOMY     TONSILLECTOMY AND ADENOIDECTOMY      History reviewed. No pertinent family history. Social History:  reports that she has never smoked. She has been exposed to tobacco smoke. She has never used smokeless tobacco. She reports current alcohol use. She reports that she does not use drugs.  Allergies:  Allergies  Allergen Reactions   Lactose Intolerance (Gi) Diarrhea and Rash   Eggs Or Egg-Derived Products Nausea And Vomiting   Molds & Smuts Other (See  Comments)    Triggers asthma   Penicillins Other (See Comments)    Family history of this- Almost killed the patient's mother and grandmother   Current Meds  Medication Sig   acetaminophen (TYLENOL) 325 MG tablet Take 325-650 mg by mouth every 6 (six) hours as needed for mild pain, headache or fever.   cetirizine (ZYRTEC) 10 MG tablet Take 1 tablet (10 mg total) by mouth daily. (Patient taking differently: Take 10 mg by mouth daily as needed for allergies or rhinitis.)   dicyclomine (BENTYL) 20 MG tablet Take 1 tablet (20 mg total) by mouth 2 (two) times daily.   phenol (CHLORASEPTIC) 1.4 % LIQD Use as directed 1 spray in the mouth or throat as needed for throat irritation / pain.     Results for orders placed or performed during the hospital encounter of 07/22/22 (from the past 48 hour(s))  CBC with Differential     Status: Abnormal   Collection Time: 07/22/22  4:35 PM  Result Value Ref Range   WBC 10.6 (H) 4.0 - 10.5 K/uL   RBC 4.98 3.87 - 5.11 MIL/uL   Hemoglobin 13.0 12.0 - 15.0 g/dL   HCT 16.1 09.6 - 04.5 %   MCV 82.5 80.0 - 100.0 fL   MCH 26.1 26.0 - 34.0 pg   MCHC 31.6 30.0 - 36.0 g/dL   RDW 40.9 81.1 - 91.4 %  Platelets 425 (H) 150 - 400 K/uL   nRBC 0.0 0.0 - 0.2 %   Neutrophils Relative % 54 %   Neutro Abs 5.9 1.7 - 7.7 K/uL   Lymphocytes Relative 36 %   Lymphs Abs 3.8 0.7 - 4.0 K/uL   Monocytes Relative 7 %   Monocytes Absolute 0.8 0.1 - 1.0 K/uL   Eosinophils Relative 1 %   Eosinophils Absolute 0.1 0.0 - 0.5 K/uL   Basophils Relative 1 %   Basophils Absolute 0.1 0.0 - 0.1 K/uL   Immature Granulocytes 1 %   Abs Immature Granulocytes 0.06 0.00 - 0.07 K/uL    Comment: Performed at [facility], [address] [place], [place] [address]  Comprehensive metabolic panel     Status: None   Collection Time: 07/22/22  4:35 PM  Result Value Ref Range   Sodium 138 135 - 145 mmol/L   Potassium 3.9 3.5 - 5.1 mmol/L   Chloride 106 98 - 111 mmol/L   CO2  26 22 - 32 mmol/L   Glucose, Bld 96 70 - 99 mg/dL    Comment: Glucose reference range applies only to samples taken after fasting for at least 8 hours.   BUN 9 6 - 20 mg/dL   Creatinine, Ser 6.16 0.44 - 1.00 mg/dL   Calcium 9.2 8.9 - 07.3 mg/dL   Total Protein 7.7 6.5 - 8.1 g/dL   Albumin 4.1 3.5 - 5.0 g/dL   AST 40 15 - 41 U/L   ALT 34 0 - 44 U/L   Alkaline Phosphatase 85 38 - 126 U/L   Total Bilirubin 0.5 0.3 - 1.2 mg/dL   GFR, Estimated >71 >06 mL/min    Comment: (NOTE) Calculated using the CKD-EPI Creatinine Equation (2021)    Anion gap 6 5 - 15    Comment: Performed at [facility], [address], [place], [place] [address]  Lipase, blood     Status: None   Collection Time: 07/22/22  4:35 PM  Result Value Ref Range   Lipase 50 11 - 51 U/L    Comment: Performed at [facility], [address], [place], [place] [address]  Triglycerides     Status: None   Collection Time: 07/22/22  4:35 PM  Result Value Ref Range   Triglycerides 92 <150 mg/dL    Comment: Performed at [facility], [address], [place], [place] [address]  Urinalysis, Routine w reflex microscopic Urine, Clean Catch     Status: None   Collection Time: 07/22/22  5:46 PM  Result Value Ref Range   Color, Urine YELLOW YELLOW   APPearance CLEAR CLEAR   Specific Gravity, Urine 1.020 1.005 - 1.030   pH 5.0 5.0 - 8.0   Glucose, UA NEGATIVE NEGATIVE mg/dL   Hgb urine dipstick NEGATIVE NEGATIVE   Bilirubin Urine NEGATIVE NEGATIVE   Ketones, ur NEGATIVE NEGATIVE mg/dL   Protein, ur NEGATIVE NEGATIVE mg/dL   Nitrite NEGATIVE NEGATIVE   Leukocytes,Ua NEGATIVE NEGATIVE    Comment: Performed at [facility], [address] [place], [place] [address]   US Abdomen Limited RUQ (LIVER/GB)  Result Date: 07/22/2022 CLINICAL DATA:  Right upper quadrant abdominal pain since 2 days. Labs consistent with pancreatitis, possible hepatic dysfunction  EXAM: ULTRASOUND ABDOMEN LIMITED RIGHT UPPER QUADRANT COMPARISON:  None Available. FINDINGS: Gallbladder: There are multiple gallstones seen with the largest gallstone measuring 1.6 cm. There is no gallbladder wall thickening seen. Gallbladder wall  measures 2 mm. No pericholecystic fluid. No sonographic Murphy's sign per the sonographer note. Common bile duct: Diameter: 4.1 mm Liver: No focal lesion identified. Within normal limits in parenchymal echogenicity. Portal vein is patent on color Doppler imaging with normal direction of blood flow towards the liver. Other: None. IMPRESSION: Cholelithiasis without ultrasonographic evidence of cholecystitis. CBD is within normal limits. Liver has a normal echo pattern. Electronically Signed   By: Marjo Bicker M.D.   On: 07/22/2022 18:40    Review of Systems  Constitutional: Negative.   HENT: Negative.    Eyes: Negative.   Respiratory: Negative.    Cardiovascular: Negative.   Gastrointestinal:  Positive for abdominal pain, nausea and vomiting.  Endocrine: Negative.   Genitourinary: Negative.   Musculoskeletal:  Positive for back pain.  Skin: Negative.   Allergic/Immunologic: Negative.   Neurological: Negative.   Hematological: Negative.   Psychiatric/Behavioral: Negative.    All other systems reviewed and are negative.   Blood pressure 109/75, pulse 60, temperature (!) 97.4 F (36.3 C), temperature source Oral, resp. rate 15, last menstrual period 06/07/2022, SpO2 100 %. Physical Exam Vitals reviewed.  Constitutional:      General: She is not in acute distress.    Appearance: She is well-developed. She is not ill-appearing, toxic-appearing or diaphoretic.  HENT:     Head: Normocephalic and atraumatic.  Eyes:     General: No scleral icterus.    Extraocular Movements: Extraocular movements intact.     Pupils: Pupils are equal, round, and reactive to light.  Cardiovascular:     Rate and Rhythm: Normal rate and regular rhythm.     Heart  sounds: Normal heart sounds. No murmur heard. Pulmonary:     Effort: Pulmonary effort is normal. No respiratory distress.     Breath sounds: No stridor. No wheezing or rhonchi.  Abdominal:     General: Abdomen is flat. Bowel sounds are decreased. There is no distension or abdominal bruit. There are no signs of injury.     Palpations: Abdomen is soft. There is no shifting dullness, fluid wave, hepatomegaly, splenomegaly or mass.     Tenderness: There is no abdominal tenderness.  Skin:    General: Skin is warm and dry.     Capillary Refill: Capillary refill takes 2 to 3 seconds.     Coloration: Skin is not cyanotic or jaundiced.  Neurological:     General: No focal deficit present.     Mental Status: She is alert and oriented to person, place, and time.     Cranial Nerves: No cranial nerve deficit.     Motor: No weakness.  Psychiatric:        Mood and Affect: Mood normal. Mood is not anxious or depressed.        Behavior: Behavior normal.      Assessment/Plan Gallstone pancreatitis Symptomatic cholelithiasis   Her enzymes have resolved, but given the length of time of her pain I am going to get a CT to evaluate any persistent evidence of pancreatitis/fluid collections.    Pt at least 40% likely to have recurrent episode in next month if gallbladder not removed.   Plan admit, pain/nausea control. IV fluids Lap chole this admit planned.    Surgery discussed with patient and sister.  Diagrams of anatomy were drawn and risks discussed.   The surgical procedure was described to the patient in detail.   I discussed the incision type and location, the location of the gallbladder, the anatomy of the  bile ducts and arteries, and the typical progression of surgery.  I discussed the possibility of converting to an open operation.  I advised of the risks of bleeding, infection, damage to other structures (such as the bile duct, intestine or liver), bile leak, need for other procedures or  surgeries, and post op diarrhea/constipation.  We discussed the risk of blood clot.  We discussed the recovery period and post operative restrictions.      Almond Lint, MD 07/22/2022, 10:45 PM

## 2022-07-22 NOTE — ED Triage Notes (Signed)
Pt reports sharp left back that radiates to the front. Pt reports going to UC yesterday and having blood work. Pt was sent here for possible pancreatitis.

## 2022-07-22 NOTE — ED Notes (Signed)
Needs IV 

## 2022-07-23 ENCOUNTER — Encounter (HOSPITAL_COMMUNITY): Payer: Self-pay

## 2022-07-23 ENCOUNTER — Observation Stay (HOSPITAL_COMMUNITY): Payer: Medicaid Other | Admitting: Anesthesiology

## 2022-07-23 ENCOUNTER — Observation Stay (HOSPITAL_COMMUNITY): Payer: Medicaid Other

## 2022-07-23 ENCOUNTER — Other Ambulatory Visit: Payer: Self-pay

## 2022-07-23 ENCOUNTER — Observation Stay (HOSPITAL_BASED_OUTPATIENT_CLINIC_OR_DEPARTMENT_OTHER): Payer: Medicaid Other | Admitting: Anesthesiology

## 2022-07-23 ENCOUNTER — Encounter (HOSPITAL_COMMUNITY): Admission: EM | Disposition: A | Discharge status: Home / Self Care | Payer: Self-pay | Source: Home / Self Care

## 2022-07-23 DIAGNOSIS — K851 Biliary acute pancreatitis without necrosis or infection: Secondary | ICD-10-CM | POA: Diagnosis not present

## 2022-07-23 HISTORY — PX: CHOLECYSTECTOMY: SHX55

## 2022-07-23 LAB — CBC
HCT: 40.4 % (ref 36.0–46.0)
Hemoglobin: 12.7 g/dL (ref 12.0–15.0)
MCH: 26.1 pg (ref 26.0–34.0)
MCHC: 31.4 g/dL (ref 30.0–36.0)
MCV: 83 fL (ref 80.0–100.0)
Platelets: 418 10*3/uL — ABNORMAL HIGH (ref 150–400)
RBC: 4.87 MIL/uL (ref 3.87–5.11)
RDW: 13.8 % (ref 11.5–15.5)
WBC: 11.6 10*3/uL — ABNORMAL HIGH (ref 4.0–10.5)
nRBC: 0 % (ref 0.0–0.2)

## 2022-07-23 LAB — COMPREHENSIVE METABOLIC PANEL
ALT: 31 U/L (ref 0–44)
AST: 34 U/L (ref 15–41)
Albumin: 3.8 g/dL (ref 3.5–5.0)
Alkaline Phosphatase: 84 U/L (ref 38–126)
Anion gap: 8 (ref 5–15)
BUN: 8 mg/dL (ref 6–20)
CO2: 25 mmol/L (ref 22–32)
Calcium: 9.2 mg/dL (ref 8.9–10.3)
Chloride: 104 mmol/L (ref 98–111)
Creatinine, Ser: 0.56 mg/dL (ref 0.44–1.00)
GFR, Estimated: >60 mL/min (ref >60)
Glucose, Bld: 89 mg/dL (ref 70–99)
Potassium: 3.5 mmol/L (ref 3.5–5.1)
Sodium: 137 mmol/L (ref 135–145)
Total Bilirubin: 0.6 mg/dL (ref 0.3–1.2)
Total Protein: 7.5 g/dL (ref 6.5–8.1)

## 2022-07-23 LAB — HIV ANTIBODY (ROUTINE TESTING W REFLEX): HIV Screen 4th Generation wRfx: NONREACTIVE

## 2022-07-23 LAB — MRSA NEXT GEN BY PCR, NASAL: MRSA by PCR Next Gen: NOT DETECTED

## 2022-07-23 SURGERY — LAPAROSCOPIC CHOLECYSTECTOMY WITH INTRAOPERATIVE CHOLANGIOGRAM
Anesthesia: General

## 2022-07-23 MED ORDER — LACTATED RINGERS IR SOLN
Status: DC | PRN
  Administered 2022-07-23: 1000 mL

## 2022-07-23 MED ORDER — AMISULPRIDE (ANTIEMETIC) 5 MG/2ML IV SOLN: 10.0000 mg | Freq: Once | INTRAVENOUS | Status: DC | PRN

## 2022-07-23 MED ORDER — ACETAMINOPHEN 500 MG PO TABS
1000.0000 mg | ORAL_TABLET | Freq: Once | ORAL | Status: AC
  Administered 2022-07-23: 1000 mg via ORAL
  Filled 2022-07-23: qty 2

## 2022-07-23 MED ORDER — PROPOFOL 10 MG/ML IV BOLUS
INTRAVENOUS | Status: AC
  Filled 2022-07-23: qty 20

## 2022-07-23 MED ORDER — HYDROMORPHONE HCL 2 MG/ML IJ SOLN
INTRAMUSCULAR | Status: AC
  Filled 2022-07-23: qty 1

## 2022-07-23 MED ORDER — ROCURONIUM BROMIDE 10 MG/ML (PF) SYRINGE
PREFILLED_SYRINGE | INTRAVENOUS | Status: DC | PRN
  Administered 2022-07-23: 100 mg via INTRAVENOUS

## 2022-07-23 MED ORDER — DEXMEDETOMIDINE (PRECEDEX) IN NS 20 MCG/5ML (4 MCG/ML) IV SYRINGE
PREFILLED_SYRINGE | INTRAVENOUS | Status: DC | PRN
  Administered 2022-07-23: 4 ug via INTRAVENOUS
  Administered 2022-07-23: 12 ug via INTRAVENOUS
  Administered 2022-07-23: 4 ug via INTRAVENOUS

## 2022-07-23 MED ORDER — LIDOCAINE HCL (PF) 2 % IJ SOLN
INTRAMUSCULAR | Status: AC
  Filled 2022-07-23: qty 5

## 2022-07-23 MED ORDER — DEXAMETHASONE SODIUM PHOSPHATE 10 MG/ML IJ SOLN
INTRAMUSCULAR | Status: DC | PRN
  Administered 2022-07-23: 10 mg via INTRAVENOUS

## 2022-07-23 MED ORDER — MEPERIDINE HCL 50 MG/ML IJ SOLN: 6.2500 mg | INTRAMUSCULAR | Status: DC | PRN

## 2022-07-23 MED ORDER — ONDANSETRON HCL 4 MG/2ML IJ SOLN: 4.0000 mg | Freq: Once | INTRAMUSCULAR | Status: DC | PRN

## 2022-07-23 MED ORDER — OXYCODONE HCL 5 MG PO TABS
5.0000 mg | ORAL_TABLET | ORAL | Status: DC | PRN
  Administered 2022-07-24: 10 mg via ORAL
  Administered 2022-07-24 (×2): 5 mg via ORAL
  Administered 2022-07-25 (×2): 10 mg via ORAL
  Filled 2022-07-23: qty 1
  Filled 2022-07-23: qty 2
  Filled 2022-07-23: qty 1
  Filled 2022-07-23 (×2): qty 2

## 2022-07-23 MED ORDER — KETOROLAC TROMETHAMINE 30 MG/ML IJ SOLN: 30.0000 mg | Freq: Once | INTRAMUSCULAR | Status: DC | PRN

## 2022-07-23 MED ORDER — IOHEXOL 300 MG/ML  SOLN
100.0000 mL | Freq: Once | INTRAMUSCULAR | Status: AC | PRN
  Administered 2022-07-23: 100 mL via INTRAVENOUS

## 2022-07-23 MED ORDER — BUPIVACAINE-EPINEPHRINE 0.25% -1:200000 IJ SOLN
INTRAMUSCULAR | Status: DC | PRN
  Administered 2022-07-23: 30 mL

## 2022-07-23 MED ORDER — FENTANYL CITRATE (PF) 250 MCG/5ML IJ SOLN
INTRAMUSCULAR | Status: AC
  Filled 2022-07-23: qty 5

## 2022-07-23 MED ORDER — DEXTROSE IN LACTATED RINGERS 5 % IV SOLN: INTRAVENOUS | Status: DC

## 2022-07-23 MED ORDER — BUPIVACAINE-EPINEPHRINE (PF) 0.25% -1:200000 IJ SOLN
INTRAMUSCULAR | Status: AC
  Filled 2022-07-23: qty 30

## 2022-07-23 MED ORDER — OXYCODONE HCL 5 MG/5ML PO SOLN: 5.0000 mg | Freq: Once | ORAL | Status: AC | PRN

## 2022-07-23 MED ORDER — LIDOCAINE 2% (20 MG/ML) 5 ML SYRINGE
INTRAMUSCULAR | Status: DC | PRN
  Administered 2022-07-23: 60 mg via INTRAVENOUS

## 2022-07-23 MED ORDER — HYDROMORPHONE HCL 1 MG/ML IJ SOLN
0.2500 mg | INTRAMUSCULAR | Status: DC | PRN
  Administered 2022-07-23: 0.5 mg via INTRAVENOUS

## 2022-07-23 MED ORDER — ONDANSETRON HCL 4 MG/2ML IJ SOLN
INTRAMUSCULAR | Status: DC | PRN
  Administered 2022-07-23: 4 mg via INTRAVENOUS

## 2022-07-23 MED ORDER — ENOXAPARIN SODIUM 40 MG/0.4ML IJ SOSY
40.0000 mg | PREFILLED_SYRINGE | INTRAMUSCULAR | Status: DC
  Administered 2022-07-24 – 2022-07-25 (×2): 40 mg via SUBCUTANEOUS
  Filled 2022-07-23 (×2): qty 0.4

## 2022-07-23 MED ORDER — HYDROMORPHONE HCL 1 MG/ML IJ SOLN
INTRAMUSCULAR | Status: DC | PRN
  Administered 2022-07-23 (×2): .5 mg via INTRAVENOUS

## 2022-07-23 MED ORDER — LACTATED RINGERS IV SOLN: INTRAVENOUS | Status: DC | PRN

## 2022-07-23 MED ORDER — PROPOFOL 10 MG/ML IV BOLUS
INTRAVENOUS | Status: DC | PRN
  Administered 2022-07-23: 200 mg via INTRAVENOUS

## 2022-07-23 MED ORDER — DEXMEDETOMIDINE HCL IN NACL 80 MCG/20ML IV SOLN
INTRAVENOUS | Status: AC
  Filled 2022-07-23: qty 20

## 2022-07-23 MED ORDER — MIDAZOLAM HCL 5 MG/5ML IJ SOLN
INTRAMUSCULAR | Status: DC | PRN
  Administered 2022-07-23: 2 mg via INTRAVENOUS

## 2022-07-23 MED ORDER — ONDANSETRON HCL 4 MG/2ML IJ SOLN
INTRAMUSCULAR | Status: AC
Start: 2022-07-23 — End: ?
  Filled 2022-07-23: qty 2

## 2022-07-23 MED ORDER — OXYCODONE HCL 5 MG PO TABS
ORAL_TABLET | ORAL | Status: AC
  Filled 2022-07-23: qty 1

## 2022-07-23 MED ORDER — SUGAMMADEX SODIUM 500 MG/5ML IV SOLN
INTRAVENOUS | Status: DC | PRN
  Administered 2022-07-23: 400 mg via INTRAVENOUS

## 2022-07-23 MED ORDER — MIDAZOLAM HCL 2 MG/2ML IJ SOLN
INTRAMUSCULAR | Status: AC
  Filled 2022-07-23: qty 2

## 2022-07-23 MED ORDER — IOPAMIDOL (ISOVUE-300) INJECTION 61%
INTRAVENOUS | Status: DC | PRN
  Administered 2022-07-23: 3.5 mL

## 2022-07-23 MED ORDER — OXYCODONE HCL 5 MG PO TABS
5.0000 mg | ORAL_TABLET | Freq: Once | ORAL | Status: AC | PRN
  Administered 2022-07-23: 5 mg via ORAL

## 2022-07-23 MED ORDER — HYDROMORPHONE HCL 1 MG/ML IJ SOLN
INTRAMUSCULAR | Status: AC
  Administered 2022-07-23: 0.5 mg via INTRAVENOUS
  Filled 2022-07-23: qty 2

## 2022-07-23 MED ORDER — SUGAMMADEX SODIUM 500 MG/5ML IV SOLN
INTRAVENOUS | Status: AC
  Filled 2022-07-23: qty 5

## 2022-07-23 MED ORDER — MORPHINE SULFATE (PF) 2 MG/ML IV SOLN
1.0000 mg | INTRAVENOUS | Status: DC | PRN
  Administered 2022-07-23 – 2022-07-24 (×5): 2 mg via INTRAVENOUS
  Filled 2022-07-23 (×5): qty 1

## 2022-07-23 MED ORDER — ROCURONIUM BROMIDE 10 MG/ML (PF) SYRINGE
PREFILLED_SYRINGE | INTRAVENOUS | Status: AC
  Filled 2022-07-23: qty 10

## 2022-07-23 MED ORDER — FENTANYL CITRATE (PF) 100 MCG/2ML IJ SOLN
INTRAMUSCULAR | Status: DC | PRN
  Administered 2022-07-23: 50 ug via INTRAVENOUS
  Administered 2022-07-23: 100 ug via INTRAVENOUS
  Administered 2022-07-23 (×2): 50 ug via INTRAVENOUS

## 2022-07-23 MED ORDER — SCOPOLAMINE 1 MG/3DAYS TD PT72
1.0000 | MEDICATED_PATCH | TRANSDERMAL | Status: DC
  Administered 2022-07-23: 1.5 mg via TRANSDERMAL
  Filled 2022-07-23: qty 1

## 2022-07-23 MED ORDER — 0.9 % SODIUM CHLORIDE (POUR BTL) OPTIME
TOPICAL | Status: DC | PRN
  Administered 2022-07-23: 1000 mL

## 2022-07-23 SURGICAL SUPPLY — 37 items
ADH SKN CLS APL DERMABOND .7 (GAUZE/BANDAGES/DRESSINGS) ×1
APL PRP STRL LF DISP 70% ISPRP (MISCELLANEOUS) ×1
APPLIER CLIP 5 13 M/L LIGAMAX5 (MISCELLANEOUS) ×1
APR CLP MED LRG 5 ANG JAW (MISCELLANEOUS) ×1
BAG COUNTER SPONGE SURGICOUNT (BAG) IMPLANT
BAG SPNG CNTER NS LX DISP (BAG)
CABLE HIGH FREQUENCY MONO STRZ (ELECTRODE) ×2 IMPLANT
CATH REDDICK CHOLANGI 4FR 50CM (CATHETERS) ×2 IMPLANT
CHLORAPREP W/TINT 26 (MISCELLANEOUS) ×2 IMPLANT
CLIP APPLIE 5 13 M/L LIGAMAX5 (MISCELLANEOUS) ×2 IMPLANT
COVER MAYO STAND XLG (MISCELLANEOUS) ×2 IMPLANT
DERMABOND ADVANCED (GAUZE/BANDAGES/DRESSINGS) ×1
DERMABOND ADVANCED .7 DNX12 (GAUZE/BANDAGES/DRESSINGS) ×2 IMPLANT
DRAPE C-ARM 42X120 X-RAY (DRAPES) ×2 IMPLANT
ELECT REM PT RETURN 15FT ADLT (MISCELLANEOUS) ×2 IMPLANT
GLOVE BIO SURGEON STRL SZ7.5 (GLOVE) ×2 IMPLANT
GOWN STRL REUS W/ TWL LRG LVL3 (GOWN DISPOSABLE) IMPLANT
GOWN STRL REUS W/TWL LRG LVL3 (GOWN DISPOSABLE)
HEMOSTAT SURGICEL 4X8 (HEMOSTASIS) IMPLANT
IRRIG SUCT STRYKERFLOW 2 WTIP (MISCELLANEOUS) ×1
IRRIGATION SUCT STRKRFLW 2 WTP (MISCELLANEOUS) ×2 IMPLANT
IV CATH 14GX2 1/4 (CATHETERS) ×2 IMPLANT
KIT BASIN OR (CUSTOM PROCEDURE TRAY) ×2 IMPLANT
KIT TURNOVER KIT A (KITS) IMPLANT
PENCIL SMOKE EVACUATOR (MISCELLANEOUS) IMPLANT
SCISSORS LAP 5X35 DISP (ENDOMECHANICALS) ×2 IMPLANT
SET TUBE SMOKE EVAC HIGH FLOW (TUBING) ×2 IMPLANT
SLEEVE Z-THREAD 5X100MM (TROCAR) ×4 IMPLANT
SPIKE FLUID TRANSFER (MISCELLANEOUS) ×2 IMPLANT
SUT MNCRL AB 4-0 PS2 18 (SUTURE) ×2 IMPLANT
SYS BAG RETRIEVAL 10MM (BASKET) ×1
SYSTEM BAG RETRIEVAL 10MM (BASKET) ×2 IMPLANT
TOWEL OR 17X26 10 PK STRL BLUE (TOWEL DISPOSABLE) ×2 IMPLANT
TOWEL OR NON WOVEN STRL DISP B (DISPOSABLE) ×2 IMPLANT
TRAY LAPAROSCOPIC (CUSTOM PROCEDURE TRAY) ×2 IMPLANT
TROCAR BALLN 12MMX100 BLUNT (TROCAR) ×2 IMPLANT
TROCAR Z-THREAD OPTICAL 5X100M (TROCAR) ×2 IMPLANT

## 2022-07-23 NOTE — Progress Notes (Signed)
Transition of Care [facility]) Screening Note  Patient Details  Name: Janice Nicholson Date of Birth: 01/10/2001  Transition of Care [facility]) CM/SW Contact:    Ewing Schlein, LCSW Phone Number: 07/23/2022, 12:49 PM  Transition of Care Department [facility]) has reviewed patient and no TOC needs have been identified at this time. We will continue to monitor patient advancement through interdisciplinary progression rounds. If new patient transition needs arise, please place a TOC consult.

## 2022-07-23 NOTE — Transfer of Care (Signed)
Immediate Anesthesia Transfer of Care Note  Patient: Janice Nicholson  Procedure(s) Performed: LAPAROSCOPIC CHOLECYSTECTOMY WITH INTRAOPERATIVE CHOLANGIOGRAM  Patient Location: PACU  Anesthesia Type:General  Level of Consciousness: awake, alert  and oriented  Airway & Oxygen Therapy: Patient Spontanous Breathing and Patient connected to face mask oxygen  Post-op Assessment: Report given to RN and Post -op Vital signs reviewed and stable  Post vital signs: Reviewed and stable  Last Vitals:  Vitals Value Taken Time  BP    Temp    Pulse 73 07/23/22 1439  Resp 16 07/23/22 1439  SpO2 100 % 07/23/22 1439  Vitals shown include unvalidated device data.  Last Pain:  Vitals:   07/23/22 1200  TempSrc:   PainSc: 5       Patients Stated Pain Goal: 0 (07/23/22 0957)  Complications: No notable events documented.

## 2022-07-23 NOTE — Plan of Care (Signed)
  Problem: Education: Goal: Required Educational Video(s) Outcome: Progressing   Problem: Clinical Measurements: Goal: Ability to maintain clinical measurements within normal limits will improve Outcome: Progressing Goal: Postoperative complications will be avoided or minimized Outcome: Progressing   Problem: Skin Integrity: Goal: Demonstration of wound healing without infection will improve Outcome: Progressing   

## 2022-07-23 NOTE — ED Notes (Signed)
Patient complains of generalized itching.  Medicated per PRN order

## 2022-07-23 NOTE — Anesthesia Preprocedure Evaluation (Addendum)
Anesthesia Evaluation  Patient identified by MRN, date of birth, ID band Patient awake    Reviewed: Allergy & Precautions, NPO status , Patient's Chart, lab work & pertinent test results  Airway Mallampati: II  TM Distance: >3 FB Neck ROM: Full    Dental  (+) Teeth Intact, Dental Advisory Given   Pulmonary asthma (uses inhaler about once per week) ,    Pulmonary exam normal breath sounds clear to auscultation       Cardiovascular negative cardio ROS Normal cardiovascular exam Rhythm:Regular Rate:Normal     Neuro/Psych PSYCHIATRIC DISORDERS Anxiety negative neurological ROS     GI/Hepatic Neg liver ROS, Gallstone pancreatitis    Endo/Other  Obesity BMI 38  Renal/GU negative Renal ROS  negative genitourinary   Musculoskeletal negative musculoskeletal ROS (+)   Abdominal (+) + obese,   Peds  Hematology negative hematology ROS (+) Hb 12.7, plt 418   Anesthesia Other Findings   Reproductive/Obstetrics Urine preg neg 8/28                           Anesthesia Physical Anesthesia Plan  ASA: 2  Anesthesia Plan: General   Post-op Pain Management: Tylenol PO (pre-op)*, Toradol IV (intra-op)*, Precedex, Ketamine IV* and Dilaudid IV   Induction: Intravenous  PONV Risk Score and Plan: 4 or greater and Ondansetron, Dexamethasone, Midazolam and Treatment may vary due to age or medical condition  Airway Management Planned: Oral ETT  Additional Equipment: None  Intra-op Plan:   Post-operative Plan: Extubation in OR  Informed Consent: I have reviewed the patients History and Physical, chart, labs and discussed the procedure including the risks, benefits and alternatives for the proposed anesthesia with the patient or authorized representative who has indicated his/her understanding and acceptance.     Dental advisory given  Plan Discussed with: CRNA  Anesthesia Plan Comments:         Anesthesia Quick Evaluation

## 2022-07-23 NOTE — Progress Notes (Addendum)
Progress Note     Subjective: Still having abdominal pain though improved from yesterday. No nausea or emesis. Mother is bedside   Objective: Vital signs in last 24 hours: Temp:  [97.4 F (36.3 C)-98.8 F (37.1 C)] 98.8 F (37.1 C) (08/30 0554) Pulse Rate:  [54-83] 59 (08/30 0554) Resp:  [10-21] 18 (08/30 0554) BP: (98-135)/(58-91) 121/68 (08/30 0554) SpO2:  [98 %-100 %] 99 % (08/30 0554) Last BM Date : 07/23/22  Intake/Output from previous day: No intake/output data recorded. Intake/Output this shift: No intake/output data recorded.  PE: General: pleasant, WD, female who is laying in bed in NAD Lungs:Respiratory effort nonlabored Abd: soft, ND, focal TTP over epigastrium and left abdomen. No RUQ TTP MSK: all 4 extremities are symmetrical with no cyanosis, clubbing, or edema. Skin: warm and dry  Psych: A&Ox3 with an appropriate affect.    Lab Results:  Recent Labs    07/22/22 1635 07/22/22 2359  WBC 10.6* 11.6*  HGB 13.0 12.7  HCT 41.1 40.4  PLT 425* 418*   BMET Recent Labs    07/22/22 1635 07/22/22 2359  NA 138 137  K 3.9 3.5  CL 106 104  CO2 26 25  GLUCOSE 96 89  BUN 9 8  CREATININE 0.53 0.56  CALCIUM 9.2 9.2   PT/INR No results for input(s): "LABPROT", "INR" in the last 72 hours. CMP     Component Value Date/Time   NA 137 07/22/2022 2359   NA 138 07/21/2022 1553   K 3.5 07/22/2022 2359   CL 104 07/22/2022 2359   CO2 25 07/22/2022 2359   GLUCOSE 89 07/22/2022 2359   BUN 8 07/22/2022 2359   BUN 10 07/21/2022 1553   CREATININE 0.56 07/22/2022 2359   CALCIUM 9.2 07/22/2022 2359   PROT 7.5 07/22/2022 2359   PROT 7.2 07/21/2022 1553   ALBUMIN 3.8 07/22/2022 2359   ALBUMIN 4.3 07/21/2022 1553   AST 34 07/22/2022 2359   ALT 31 07/22/2022 2359   ALKPHOS 84 07/22/2022 2359   BILITOT 0.6 07/22/2022 2359   BILITOT 0.5 07/21/2022 1553   GFRNONAA >60 07/22/2022 2359   Lipase     Component Value Date/Time   LIPASE 50 07/22/2022 1635        Studies/Results: CT ABDOMEN PELVIS W CONTRAST  Result Date: 07/23/2022 CLINICAL DATA:  Abnormal labs, possible pancreatitis EXAM: CT ABDOMEN AND PELVIS WITH CONTRAST TECHNIQUE: Multidetector CT imaging of the abdomen and pelvis was performed using the standard protocol following bolus administration of intravenous contrast. RADIATION DOSE REDUCTION: This exam was performed according to the departmental dose-optimization program which includes automated exposure control, adjustment of the mA and/or kV according to patient size and/or use of iterative reconstruction technique. CONTRAST:  OMNIPAQUE IOHEXOL 300 MG/ML  SOLN COMPARISON:  None Available. FINDINGS: Lower chest: Lung bases are clear. Hepatobiliary: Liver is within normal limits. Layering gallbladder sludge versus noncalcified gallstones (series 2/image 31), without associated inflammatory changes. No intrahepatic or extrahepatic ductal dilatation. Pancreas: Within normal limits. No peripancreatic inflammatory changes or fluid collections. Spleen: Within normal limits. Adrenals/Urinary Tract: Adrenal glands are within normal limits. 19 mm simple interpolar left renal cyst (series 2/image 38), benign (Bosniak I). No follow-up is recommended. Right kidney is within normal limits. No hydronephrosis. Bladder is underdistended but unremarkable. Stomach/Bowel: Stomach is within normal limits. No evidence of bowel obstruction. Normal appendix (series 2/image 54). No colonic wall thickening or inflammatory changes. Vascular/Lymphatic: No evidence of abdominal aortic aneurysm. No suspicious abdominopelvic lymphadenopathy. Reproductive: Uterus  and bilateral ovaries are within normal limits. Other: No abdominopelvic ascites. Musculoskeletal: Visualized osseous structures are within normal limits. IMPRESSION: Pancreas is within normal limits on CT. Layering gallbladder sludge versus noncalcified gallstones, without associated inflammatory changes.  Electronically Signed   By: Charline Bills M.D.   On: 07/23/2022 00:27   US Abdomen Limited RUQ (LIVER/GB)  Result Date: 07/22/2022 CLINICAL DATA:  Right upper quadrant abdominal pain since 2 days. Labs consistent with pancreatitis, possible hepatic dysfunction EXAM: ULTRASOUND ABDOMEN LIMITED RIGHT UPPER QUADRANT COMPARISON:  None Available. FINDINGS: Gallbladder: There are multiple gallstones seen with the largest gallstone measuring 1.6 cm. There is no gallbladder wall thickening seen. Gallbladder wall measures 2 mm. No pericholecystic fluid. No sonographic Murphy's sign per the sonographer note. Common bile duct: Diameter: 4.1 mm Liver: No focal lesion identified. Within normal limits in parenchymal echogenicity. Portal vein is patent on color Doppler imaging with normal direction of blood flow towards the liver. Other: None. IMPRESSION: Cholelithiasis without ultrasonographic evidence of cholecystitis. CBD is within normal limits. Liver has a normal echo pattern. Electronically Signed   By: Marjo Bicker M.D.   On: 07/22/2022 18:40    Anti-infectives: Anti-infectives (From admission, onward)    Start     Dose/Rate Route Frequency Ordered Stop   07/23/22 0000  ciprofloxacin (CIPRO) IVPB 400 mg        400 mg 200 mL/hr over 60 Minutes Intravenous Every 12 hours 07/22/22 2315 07/29/22 2359        Assessment/Plan  Gallstone pancreatitis Symptomatic cholelithiasis  - CT scan shows pancreas WNL and lipase normalized to 50 - tentatively plan for lap chole today. Still with some abdominal TTP today and if not improving may wait until tomorrow for OR  Addendum: reassessed patient and abdominal pain continues to improve. Plan for OR lap chole bu Dr. Carolynne Edouard today  FEN: NPO for OR, IVF @100ml /hr ID: ciprofloxacin VTE: lovenox  I reviewed last 24 h vitals and pain scores, last 48 h intake and output, last 24 h labs and trends, and last 24 h imaging results.     LOS: 0 days   , [facility] 07/23/2022, 8:37 AM Please see Amion for pager number during day hours 7:00am-4:30pm

## 2022-07-23 NOTE — Anesthesia Postprocedure Evaluation (Signed)
Anesthesia Post Note  Patient: [name]  Procedure(s) Performed: LAPAROSCOPIC CHOLECYSTECTOMY WITH INTRAOPERATIVE CHOLANGIOGRAM     Patient location during evaluation: PACU Anesthesia Type: General Level of consciousness: awake and alert, oriented and patient cooperative Pain management: pain level controlled Vital Signs Assessment: post-procedure vital signs reviewed and stable Respiratory status: spontaneous breathing, nonlabored ventilation and respiratory function stable Cardiovascular status: blood pressure returned to baseline and stable Postop Assessment: no apparent nausea or vomiting Anesthetic complications: no   No notable events documented.  Last Vitals:  Vitals:   07/23/22 1445 07/23/22 1500  BP: 132/73 135/75  Pulse: 65   Resp: 19   Temp:    SpO2: 100%     Last Pain:  Vitals:   07/23/22 1500  TempSrc:   PainSc: 0-No pain                 Lannie Fields

## 2022-07-23 NOTE — Anesthesia Procedure Notes (Signed)
Procedure Name: Intubation Date/Time: 07/23/2022 12:51 PM  Performed by: Maxwell Caul, CRNAPre-anesthesia Checklist: Patient identified, Emergency Drugs available, Suction available and Patient being monitored Patient Re-evaluated:Patient Re-evaluated prior to induction Oxygen Delivery Method: Circle system utilized Preoxygenation: Pre-oxygenation with 100% oxygen Induction Type: IV induction Ventilation: Mask ventilation without difficulty Laryngoscope Size: Mac and 4 Grade View: Grade I Tube type: Oral Tube size: 7.5 mm Number of attempts: 1 Airway Equipment and Method: Stylet Placement Confirmation: ETT inserted through vocal cords under direct vision, positive ETCO2 and breath sounds checked- equal and bilateral Secured at: 21 cm Tube secured with: Tape Dental Injury: Teeth and Oropharynx as per pre-operative assessment

## 2022-07-23 NOTE — Op Note (Addendum)
07/23/2022  2:26 PM  PATIENT:  Janice Nicholson  21 y.o. female  PRE-OPERATIVE DIAGNOSIS:  GALLSTONE PANCREATITIS  POST-OPERATIVE DIAGNOSIS:  GALLSTONE PANCREATITIS  PROCEDURE:  Procedure(s): LAPAROSCOPIC CHOLECYSTECTOMY WITH INTRAOPERATIVE CHOLANGIOGRAM (N/A)  SURGEON:  Surgeon(s) and Role:    * Griselda Miner, MD - Primary  PHYSICIAN ASSISTANT:   ASSISTANTS: none   ANESTHESIA:   local and general  EBL:  25 mL   BLOOD ADMINISTERED:none  DRAINS: none   LOCAL MEDICATIONS USED:  MARCAINE     SPECIMEN:  Source of Specimen:  gallbladder  DISPOSITION OF SPECIMEN:  PATHOLOGY  COUNTS:  YES  TOURNIQUET:  * No tourniquets in log *  DICTATION: .Dragon Dictation    Procedure: After informed consent was obtained the patient was brought to the operating room and placed in the supine position on the operating room table. After adequate induction of general anesthesia the patient's abdomen was prepped with ChloraPrep allowed to dry and draped in usual sterile manner. An appropriate timeout was performed. The area above the umbilicus was infiltrated with quarter percent  Marcaine. A small incision was made with a 15 blade knife. The incision was carried down through the subcutaneous tissue bluntly with a hemostat and [employer] retractors. The linea alba was identified. The linea alba was incised with a 15 blade knife and each side was grasped with Coker clamps. The preperitoneal space was then probed with a hemostat until the peritoneum was opened and access was gained to the abdominal cavity. A 0 Vicryl pursestring stitch was placed in the fascia surrounding the opening. A Hassan cannula was then placed through the opening and anchored in place with the previously placed Vicryl purse string stitch. The abdomen was insufflated with carbon dioxide without difficulty. A laparoscope was inserted through the [facility] cannula in the right upper quadrant was inspected. Next the epigastric region was  infiltrated with % Marcaine. A small incision was made with a 15 blade knife. A 5 mm port was placed bluntly through this incision into the abdominal cavity under direct vision. Next 2 sites were chosen laterally on the right side of the abdomen for placement of 5 mm ports. Each of these areas was infiltrated with quarter percent Marcaine. Small stab incisions were made with a 15 blade knife. 5 mm ports were then placed bluntly through these incisions into the abdominal cavity under direct vision without difficulty. A blunt grasper was placed through the lateralmost 5 mm port and used to grasp the dome of the gallbladder and elevate it anteriorly and superiorly. Another blunt grasper was placed through the other 5 mm port and used to retract the body and neck of the gallbladder. A dissector was placed through the epigastric port and using the electrocautery the peritoneal reflection at the gallbladder neck was opened. Blunt dissection was then carried out in this area until the gallbladder neck-cystic duct junction was readily identified and a good window was created. A single clip was placed on the gallbladder neck. A small  ductotomy was made just below the clip with laparoscopic scissors. A 14-gauge Angiocath was then placed through the anterior abdominal wall under direct vision. A Reddick cholangiogram catheter was then placed through the Angiocath and flushed. The catheter was then placed in the cystic duct and anchored in place with a clip. A cholangiogram was obtained that showed no filling defects, good emptying into the duodenum, and the cystic duct was noted to be very short.  The anchoring clip and catheters were then removed  from the patient. 2 clips were placed proximally on the cystic duct and the duct was divided between the 2 sets of clips. Posterior to this the cystic artery was identified and again dissected bluntly in a circumferential manner until a good window  was created. 2 clips were placed  proximally and one distally on the artery and the artery was divided between the 2 sets of clips. Next a laparoscopic hook cautery device was used to separate the gallbladder from the liver bed. Prior to completely detaching the gallbladder from the liver bed the liver bed was inspected and several small bleeding points were coagulated with the electrocautery until the area was completely hemostatic. The gallbladder was then detached the rest of it from the liver bed without difficulty. A laparoscopic bag was inserted through the hassan port. The laparoscope was moved to the epigastric port. The gallbladder was placed within the bag and the bag was sealed.  The bag with the gallbladder was then removed with the [facility] cannula through the infraumbilical port without difficulty. The fascial defect was then closed with the previously placed Vicryl pursestring stitch as well as with another figure-of-eight 0 Vicryl stitch. The liver bed was inspected again and found to be hemostatic. The abdomen was irrigated with copious amounts of saline until the effluent was clear. The ports were then removed under direct vision without difficulty and were found to be hemostatic. The gas was allowed to escape. No other abnormalities were noted on general inspection of the abdomen. The skin incisions were all closed with interrupted 4-0 Monocryl subcuticular stitches. Dermabond dressings were applied. The patient tolerated the procedure well. At the end of the case all needle sponge and instrument counts were correct. The patient was then awakened and taken to recovery in stable condition   PLAN OF CARE: Admit to inpatient   PATIENT DISPOSITION:  PACU - hemodynamically stable.   Delay start of Pharmacological VTE agent (>24hrs) due to surgical blood loss or risk of bleeding: no

## 2022-07-23 NOTE — Discharge Instructions (Signed)
[facility] ______[facility] LAPAROSCOPIC SURGERY: POST OP INSTRUCTIONS Always review your discharge instruction sheet given to you by the facility where your surgery was performed. IF YOU HAVE DISABILITY OR FAMILY LEAVE FORMS, YOU MUST BRING THEM TO THE OFFICE FOR PROCESSING.   DO NOT GIVE THEM TO YOUR DOCTOR.  A prescription for pain medication may be given to you upon discharge.  Take your pain medication as prescribed, if needed.  If narcotic pain medicine is not needed, then you may take acetaminophen (Tylenol) or ibuprofen (Advil) as needed. Take your usually prescribed medications unless otherwise directed. If you need a refill on your pain medication, please contact your pharmacy.  They will contact our office to request authorization. Prescriptions will not be filled after 5pm or on week-ends. You should follow a light diet the first few days after arrival home, such as soup and crackers, etc.  Be sure to include lots of fluids daily. Most patients will experience some swelling and bruising in the area of the incisions.  Ice packs will help.  Swelling and bruising can take several days to resolve.  It is common to experience some constipation if taking pain medication after surgery.  Increasing fluid intake and taking a stool softener (such as Colace) will usually help or prevent this problem from occurring.  A mild laxative (Milk of Magnesia or Miralax) should be taken according to package instructions if there are no bowel movements after 48 hours. Unless discharge instructions indicate otherwise, you may remove your bandages 24-48 hours after surgery, and you may shower at that time.  You may have steri-strips (small skin tapes) in place directly over the incision.  These strips should be left on the skin for 7-10 days.  If your surgeon used skin glue on the incision, you may shower in 24 hours.  The glue will flake off over the next 2-3 weeks.  Any sutures or staples will be  removed at the office during your follow-up visit. ACTIVITIES:  You may resume regular (light) daily activities beginning the next day--such as daily self-care, walking, climbing stairs--gradually increasing activities as tolerated.  You may have sexual intercourse when it is comfortable.  Refrain from any heavy lifting or straining until approved by your doctor. You may drive when you are no longer taking prescription pain medication, you can comfortably wear a seatbelt, and you can safely maneuver your car and apply brakes. RETURN TO WORK:  __________________________________________________________ You should see your doctor in the office for a follow-up appointment approximately 2-3 weeks after your surgery.  Make sure that you call for this appointment within a day or two after you arrive home to insure a convenient appointment time. OTHER INSTRUCTIONS: __________________________________________________________________________________________________________________________ __________________________________________________________________________________________________________________________ WHEN TO CALL YOUR DOCTOR: Fever over 101.0 Inability to urinate Continued bleeding from incision. Increased pain, redness, or drainage from the incision. Increasing abdominal pain  The clinic staff is available to answer your questions during regular business hours.  Please don't hesitate to call and ask to speak to one of the nurses for clinical concerns.  If you have a medical emergency, go to the nearest emergency room or call 911.  A surgeon from [facility] is always on call at the hospital. [address], [address], [place], [place]  [address] ? [address] [address], [place], [place]   [address] [phone] ? [phone] ? [phone] Web site: [email or web address]  

## 2022-07-24 ENCOUNTER — Encounter (HOSPITAL_COMMUNITY): Payer: Self-pay | Admitting: General Surgery

## 2022-07-24 DIAGNOSIS — K851 Biliary acute pancreatitis without necrosis or infection: Secondary | ICD-10-CM | POA: Diagnosis present

## 2022-07-24 DIAGNOSIS — J45909 Unspecified asthma, uncomplicated: Secondary | ICD-10-CM | POA: Diagnosis present

## 2022-07-24 DIAGNOSIS — Z880 Allergy status to penicillin: Secondary | ICD-10-CM | POA: Diagnosis not present

## 2022-07-24 DIAGNOSIS — Z91012 Allergy to eggs: Secondary | ICD-10-CM | POA: Diagnosis not present

## 2022-07-24 DIAGNOSIS — E739 Lactose intolerance, unspecified: Secondary | ICD-10-CM | POA: Diagnosis present

## 2022-07-24 DIAGNOSIS — Z79899 Other long term (current) drug therapy: Secondary | ICD-10-CM | POA: Diagnosis not present

## 2022-07-24 DIAGNOSIS — K8010 Calculus of gallbladder with chronic cholecystitis without obstruction: Secondary | ICD-10-CM | POA: Diagnosis present

## 2022-07-24 DIAGNOSIS — R1013 Epigastric pain: Secondary | ICD-10-CM | POA: Diagnosis present

## 2022-07-24 DIAGNOSIS — Z6838 Body mass index (BMI) 38.0-38.9, adult: Secondary | ICD-10-CM | POA: Diagnosis not present

## 2022-07-24 DIAGNOSIS — F909 Attention-deficit hyperactivity disorder, unspecified type: Secondary | ICD-10-CM | POA: Diagnosis present

## 2022-07-24 DIAGNOSIS — E669 Obesity, unspecified: Secondary | ICD-10-CM | POA: Diagnosis present

## 2022-07-24 DIAGNOSIS — Z888 Allergy status to other drugs, medicaments and biological substances status: Secondary | ICD-10-CM | POA: Diagnosis not present

## 2022-07-24 HISTORY — DX: Biliary acute pancreatitis without necrosis or infection: K85.10

## 2022-07-24 LAB — SURGICAL PATHOLOGY

## 2022-07-24 MED ORDER — METHOCARBAMOL 500 MG PO TABS
500.0000 mg | ORAL_TABLET | Freq: Four times a day (QID) | ORAL | Status: DC
  Administered 2022-07-24 – 2022-07-25 (×5): 500 mg via ORAL
  Filled 2022-07-24 (×5): qty 1

## 2022-07-24 MED ORDER — DOCUSATE SODIUM 100 MG PO CAPS
100.0000 mg | ORAL_CAPSULE | Freq: Two times a day (BID) | ORAL | Status: DC
  Administered 2022-07-24 – 2022-07-25 (×3): 100 mg via ORAL
  Filled 2022-07-24 (×3): qty 1

## 2022-07-24 MED ORDER — ACETAMINOPHEN 325 MG PO TABS
650.0000 mg | ORAL_TABLET | Freq: Four times a day (QID) | ORAL | Status: DC
  Administered 2022-07-24 – 2022-07-25 (×5): 650 mg via ORAL
  Filled 2022-07-24 (×5): qty 2

## 2022-07-24 MED ORDER — IBUPROFEN 600 MG PO TABS
600.0000 mg | ORAL_TABLET | Freq: Four times a day (QID) | ORAL | Status: DC
  Administered 2022-07-24 – 2022-07-25 (×3): 600 mg via ORAL
  Filled 2022-07-24 (×3): qty 1
  Filled 2022-07-24: qty 2

## 2022-07-24 NOTE — Plan of Care (Signed)
  Problem: Clinical Measurements: Goal: Ability to maintain clinical measurements within normal limits will improve Outcome: Progressing   Problem: Clinical Measurements: Goal: Ability to maintain clinical measurements within normal limits will improve Outcome: Progressing   Problem: Activity: Goal: Risk for activity intolerance will decrease Outcome: Progressing   Problem: Nutrition: Goal: Adequate nutrition will be maintained Outcome: Progressing   Problem: Pain Managment: Goal: General experience of comfort will improve Outcome: Progressing   Problem: Elimination: Goal: Will not experience complications related to urinary retention Outcome: Progressing

## 2022-07-24 NOTE — Progress Notes (Signed)
Progress Note  1 Day Post-Op  Subjective: Having postoperative abdominal soreness.  Tolerating clear liquids without nausea/emesis.  Passing flatus.  P.o. intake does not make the pain worse.  She has ambulated in the hallway since surgery.  Mother and friend are at bedside  Objective: Vital signs in last 24 hours: Temp:  [97.6 F (36.4 C)-98.8 F (37.1 C)] 98.5 F (36.9 C) (08/31 0932) Pulse Rate:  [52-78] 65 (08/31 0932) Resp:  [11-19] 18 (08/31 0932) BP: (119-146)/(62-87) 127/64 (08/31 0932) SpO2:  [95 %-100 %] 98 % (08/31 0932) Weight:  [105.2 kg] 105.2 kg (08/30 1200) Last BM Date : 07/22/22 (per pt)  Intake/Output from previous day: 08/30 0701 - 08/31 0700 In: 2153.6 [P.O.:700; I.V.:1100; IV Piggyback:353.6] Out: 25 [Blood:25] Intake/Output this shift: Total I/O In: 480 [P.O.:480] Out: 0   PE: General: pleasant, WD, female who is laying in bed in NAD Lungs:Respiratory effort nonlabored Abd: soft, ND, mild TTP diffusely over abdomen, greatest over incisions.  Incisions are C/D/I  MSK: all 4 extremities are symmetrical with no cyanosis, clubbing, or edema. Skin: warm and dry  Psych: A&Ox3 with an appropriate affect.    Lab Results:  Recent Labs    07/22/22 1635 07/22/22 2359  WBC 10.6* 11.6*  HGB 13.0 12.7  HCT 41.1 40.4  PLT 425* 418*    BMET Recent Labs    07/22/22 1635 07/22/22 2359  NA 138 137  K 3.9 3.5  CL 106 104  CO2 26 25  GLUCOSE 96 89  BUN 9 8  CREATININE 0.53 0.56  CALCIUM 9.2 9.2    PT/INR No results for input(s): "LABPROT", "INR" in the last 72 hours. CMP     Component Value Date/Time   NA 137 07/22/2022 2359   NA 138 07/21/2022 1553   K 3.5 07/22/2022 2359   CL 104 07/22/2022 2359   CO2 25 07/22/2022 2359   GLUCOSE 89 07/22/2022 2359   BUN 8 07/22/2022 2359   BUN 10 07/21/2022 1553   CREATININE 0.56 07/22/2022 2359   CALCIUM 9.2 07/22/2022 2359   PROT 7.5 07/22/2022 2359   PROT 7.2 07/21/2022 1553   ALBUMIN 3.8  07/22/2022 2359   ALBUMIN 4.3 07/21/2022 1553   AST 34 07/22/2022 2359   ALT 31 07/22/2022 2359   ALKPHOS 84 07/22/2022 2359   BILITOT 0.6 07/22/2022 2359   BILITOT 0.5 07/21/2022 1553   GFRNONAA >60 07/22/2022 2359   Lipase     Component Value Date/Time   LIPASE 50 07/22/2022 1635       Studies/Results: DG Cholangiogram Operative  Result Date: 07/23/2022 CLINICAL DATA:  Gallbladder sludge versus gallstones EXAM: INTRAOPERATIVE CHOLANGIOGRAM TECHNIQUE: Cholangiographic images from the C-arm fluoroscopic device were submitted for interpretation post-operatively. Please see the procedural report for the amount of contrast and the fluoroscopy time utilized. COMPARISON:  CT from earlier the same day FINDINGS: No persistent filling defects in the common duct. Intrahepatic ducts are incompletely visualized, appearing decompressed centrally. Contrast passes into the duodenum. : Negative for retained common duct stone. Electronically Signed   By: Corlis Leak M.D.   On: 07/23/2022 14:19   CT ABDOMEN PELVIS W CONTRAST  Result Date: 07/23/2022 CLINICAL DATA:  Abnormal labs, possible pancreatitis EXAM: CT ABDOMEN AND PELVIS WITH CONTRAST TECHNIQUE: Multidetector CT imaging of the abdomen and pelvis was performed using the standard protocol following bolus administration of intravenous contrast. RADIATION DOSE REDUCTION: This exam was performed according to the departmental dose-optimization program which includes automated exposure control,  adjustment of the mA and/or kV according to patient size and/or use of iterative reconstruction technique. CONTRAST:  OMNIPAQUE IOHEXOL 300 MG/ML  SOLN COMPARISON:  None Available. FINDINGS: Lower chest: Lung bases are clear. Hepatobiliary: Liver is within normal limits. Layering gallbladder sludge versus noncalcified gallstones (series 2/image 31), without associated inflammatory changes. No intrahepatic or extrahepatic ductal dilatation. Pancreas: Within  normal limits. No peripancreatic inflammatory changes or fluid collections. Spleen: Within normal limits. Adrenals/Urinary Tract: Adrenal glands are within normal limits. 19 mm simple interpolar left renal cyst (series 2/image 38), benign (Bosniak I). No follow-up is recommended. Right kidney is within normal limits. No hydronephrosis. Bladder is underdistended but unremarkable. Stomach/Bowel: Stomach is within normal limits. No evidence of bowel obstruction. Normal appendix (series 2/image 54). No colonic wall thickening or inflammatory changes. Vascular/Lymphatic: No evidence of abdominal aortic aneurysm. No suspicious abdominopelvic lymphadenopathy. Reproductive: Uterus and bilateral ovaries are within normal limits. Other: No abdominopelvic ascites. Musculoskeletal: Visualized osseous structures are within normal limits. IMPRESSION: Pancreas is within normal limits on CT. Layering gallbladder sludge versus noncalcified gallstones, without associated inflammatory changes. Electronically Signed   By: Charline Bills M.D.   On: 07/23/2022 00:27   US Abdomen Limited RUQ (LIVER/GB)  Result Date: 07/22/2022 CLINICAL DATA:  Right upper quadrant abdominal pain since 2 days. Labs consistent with pancreatitis, possible hepatic dysfunction EXAM: ULTRASOUND ABDOMEN LIMITED RIGHT UPPER QUADRANT COMPARISON:  None Available. FINDINGS: Gallbladder: There are multiple gallstones seen with the largest gallstone measuring 1.6 cm. There is no gallbladder wall thickening seen. Gallbladder wall measures 2 mm. No pericholecystic fluid. No sonographic Murphy's sign per the sonographer note. Common bile duct: Diameter: 4.1 mm Liver: No focal lesion identified. Within normal limits in parenchymal echogenicity. Portal vein is patent on color Doppler imaging with normal direction of blood flow towards the liver. Other: None. IMPRESSION: Cholelithiasis without ultrasonographic evidence of cholecystitis. CBD is within normal limits.  Liver has a normal echo pattern. Electronically Signed   By: Marjo Bicker M.D.   On: 07/22/2022 18:40    Anti-infectives: Anti-infectives (From admission, onward)    Start     Dose/Rate Route Frequency Ordered Stop   07/23/22 0000  ciprofloxacin (CIPRO) IVPB 400 mg  Status:  Discontinued        400 mg 200 mL/hr over 60 Minutes Intravenous Every 12 hours 07/22/22 2315 07/23/22 1624        Assessment/Plan  Gallstone pancreatitis Symptomatic cholelithiasis  POD 1 s/p laparoscopic cholecystectomy with negative intraoperative cholangiogram by Dr. Carolynne Edouard on 8/30 -Surgical pathology pending -Having postoperative abdominal pain that is not well controlled on current pain regimen.  Have made adjustments to her pain medications -Tolerating clear liquids and will advance to soft but cautioned patient to proceed slowly with oral intake -Encouraged ambulation -Likely home in the morning  FEN: Soft, SLIV ID: ciprofloxacin VTE: lovenox    LOS: 0 days   Eric Form, [facility] 07/24/2022, 11:59 AM Please see Amion for pager number during day hours 7:00am-4:30pm

## 2022-07-25 MED ORDER — DOCUSATE SODIUM 100 MG PO CAPS: 100.0000 mg | ORAL_CAPSULE | Freq: Two times a day (BID) | ORAL | 0 refills | Status: DC | PRN

## 2022-07-25 MED ORDER — METHOCARBAMOL 500 MG PO TABS: 500.0000 mg | ORAL_TABLET | Freq: Three times a day (TID) | ORAL | 0 refills | Status: AC | PRN

## 2022-07-25 MED ORDER — IBUPROFEN 600 MG PO TABS: 600.0000 mg | ORAL_TABLET | Freq: Four times a day (QID) | ORAL | 0 refills | Status: DC | PRN

## 2022-07-25 MED ORDER — OXYCODONE HCL 5 MG PO TABS: 5.0000 mg | ORAL_TABLET | Freq: Four times a day (QID) | ORAL | 0 refills | Status: AC | PRN

## 2022-07-25 NOTE — Discharge Summary (Signed)
[facility] Discharge Summary   Patient ID: Janice Nicholson MRN: [id number] DOB/AGE: [birth year] 21 y.o.  Admit date: 07/22/2022 Discharge date: 07/25/2022  Admitting Diagnosis: Gallstones [K80.20] Gallstone pancreatitis [K85.10] Pain of upper abdomen [R10.10] Acute biliary pancreatitis [K85.10]   Discharge Diagnosis Gallstones [K80.20] Gallstone pancreatitis [K85.10] Pain of upper abdomen [R10.10] Acute biliary pancreatitis [K85.10] S/p Laparoscopic Cholecystectomy with IOC  Consultants None  Imaging: DG Cholangiogram Operative  Result Date: 07/23/2022 CLINICAL DATA:  Gallbladder sludge versus gallstones EXAM: INTRAOPERATIVE CHOLANGIOGRAM TECHNIQUE: Cholangiographic images from the C-arm fluoroscopic device were submitted for interpretation post-operatively. Please see the procedural report for the amount of contrast and the fluoroscopy time utilized. COMPARISON:  CT from earlier the same day FINDINGS: No persistent filling defects in the common duct. Intrahepatic ducts are incompletely visualized, appearing decompressed centrally. Contrast passes into the duodenum. : Negative for retained common duct stone. Electronically Signed   By: Corlis Leak M.D.   On: 07/23/2022 14:19    Procedures Dr. Carolynne Edouard (07/23/22) - Laparoscopic Cholecystectomy with [facility]   Hospital Course:  21 year old female who presented to [facility] at recommendation of Urgent care with abdominal pain.  Workup showed symptomatic cholelithiasis with gallstone pancreatitis.  Patient was admitted and underwent procedure listed above.  Tolerated procedure well and was transferred to the floor.  Diet was advanced as tolerated.  On POD2, the patient was voiding well, tolerating diet, ambulating well, pain well controlled, vital signs stable, incisions c/d/i and felt stable for discharge home.  Patient will follow up in our office in 3 weeks and knows to call with questions or concerns.    Physical Exam: General:  Alert,  NAD, pleasant, comfortable Abd:  Soft, ND, mild tenderness, incisions C/D/I with surgical glue  I or a member of my team have reviewed this patient in the Controlled Substance Database.   Allergies as of 07/25/2022       Reactions   Lactose Intolerance (gi) Diarrhea, Rash   Eggs Or Egg-derived Products Nausea And Vomiting   Molds & Smuts Other (See Comments)   Triggers asthma   Penicillins Other (See Comments)   Family history of this- Almost killed the patient's mother and grandmother        Medication List     TAKE these medications    acetaminophen 325 MG tablet Commonly known as: TYLENOL Take 325-650 mg by mouth every 6 (six) hours as needed for mild pain, headache or fever.   albuterol 108 (90 Base) MCG/ACT inhaler Commonly known as: Ventolin HFA Inhale 1 puff into the lungs every 6 (six) hours as needed for wheezing or shortness of breath.   cetirizine 10 MG tablet Commonly known as: ZYRTEC Take 1 tablet (10 mg total) by mouth daily. What changed:  when to take this reasons to take this   dicyclomine 20 MG tablet Commonly known as: BENTYL Take 1 tablet (20 mg total) by mouth 2 (two) times daily.   docusate sodium 100 MG capsule Commonly known as: COLACE Take 1 capsule (100 mg total) by mouth 2 (two) times daily as needed for mild constipation or moderate constipation.   fluticasone 50 MCG/ACT nasal spray Commonly known as: FLONASE Place 1 spray into both nostrils daily.   ibuprofen 600 MG tablet Commonly known as: ADVIL Take 1 tablet (600 mg total) by mouth every 6 (six) hours as needed.   Investigational - Study Medication Inhale 2 puffs into the lungs See admin instructions. Hailie study inhalers "AV007- Batura" [phone] or [phone]) Inhale  2 puffs into the lungs every 4 hours as needed for wheezing or shortness of breath Additional study details: Study started in early 06/2022 and will last through approx 08/2022. Med ID [id number]; Batch 42683.41    methocarbamol 500 MG tablet Commonly known as: ROBAXIN Take 1 tablet (500 mg total) by mouth every 8 (eight) hours as needed for up to 5 days for muscle spasms (abdominal pain).   oxyCODONE 5 MG immediate release tablet Commonly known as: Oxy IR/ROXICODONE Take 1 tablet (5 mg total) by mouth every 6 (six) hours as needed for up to 5 days for moderate pain or severe pain (5mg  moderate, 10mg  severe).   phenol 1.4 % Liqd Commonly known as: CHLORASEPTIC Use as directed 1 spray in the mouth or throat as needed for throat irritation / pain.          Follow-up Information     [facility], , . Go on 08/14/2022.   Specialty: General Surgery Why: follow up on 08/14/22 at 8:30 am. please arrive 30 minutes early to complete check in process and bring photo ID and insurance card if you have them Contact information: [address] [place] [address] [place] [phone]                 Signed: [id number] [facility] 07/25/2022, 11:07 AM Please see Amion for pager number during day hours 7:00am-4:30pm

## 2022-11-24 NOTE — L&D Delivery Note (Signed)
OB/[facility]N Faculty Practice Delivery Note  Janice Nicholson is a 23 y.o. G1P0 s/p SVD at [redacted]w[redacted]d. She was admitted for IOL for PROM.   ROM: 23h 68m with clear fluid GBS Status:  Positive/-- (07/30 1137) Maximum Maternal Temperature: 44F  Labor Progress: Initial SVE: 1.5/20/-3. She then progressed to complete.   Delivery Date/Time: [phone] Delivery: Called to room and patient was complete and pushing. Head delivered ROA. No nuchal cord present. Shoulder and body delivered in usual fashion. Infant with spontaneous cry, placed on mother's abdomen, dried and stimulated. Cord clamped x 2 after 1-minute delay, and cut by PGM. Cord blood drawn. Placenta delivered spontaneously with gentle cord traction. Fundus firm with massage and Pitocin. Labia, perineum, vagina, and cervix inspected with hemostatic 1st degree perineal and periurethral lacerations, no sutures required. Mom and baby doing well.   Baby Weight: pending  Placenta: 3 vessel, intact. Sent to L&D Complications: None Lacerations: as above EBL: 347 mL Analgesia: Epidural   Infant:  APGAR (1 MIN): 9  APGAR (5 MINS): 9   Hessie Dibble, MD [facility] Family Medicine Fellow, [facility] for [facility], [facility] 07/22/2023, 9:07 AM

## 2022-12-20 ENCOUNTER — Emergency Department (HOSPITAL_COMMUNITY): Payer: Medicaid Other

## 2022-12-20 ENCOUNTER — Encounter (HOSPITAL_COMMUNITY): Payer: Self-pay

## 2022-12-20 ENCOUNTER — Emergency Department (HOSPITAL_COMMUNITY)
Admission: EM | Admit: 2022-12-20 | Discharge: 2022-12-20 | Disposition: A | Discharge status: Home / Self Care | Payer: Medicaid Other | Attending: Emergency Medicine | Admitting: Emergency Medicine

## 2022-12-20 DIAGNOSIS — S52614A Nondisplaced fracture of right ulna styloid process, initial encounter for closed fracture: Secondary | ICD-10-CM | POA: Diagnosis not present

## 2022-12-20 DIAGNOSIS — S0083XA Contusion of other part of head, initial encounter: Secondary | ICD-10-CM | POA: Insufficient documentation

## 2022-12-20 DIAGNOSIS — M546 Pain in thoracic spine: Secondary | ICD-10-CM | POA: Diagnosis not present

## 2022-12-20 DIAGNOSIS — M545 Low back pain, unspecified: Secondary | ICD-10-CM | POA: Diagnosis not present

## 2022-12-20 DIAGNOSIS — H7292 Unspecified perforation of tympanic membrane, left ear: Secondary | ICD-10-CM

## 2022-12-20 DIAGNOSIS — Z3A10 10 weeks gestation of pregnancy: Secondary | ICD-10-CM | POA: Insufficient documentation

## 2022-12-20 DIAGNOSIS — T148XXA Other injury of unspecified body region, initial encounter: Secondary | ICD-10-CM

## 2022-12-20 DIAGNOSIS — O9A211 Injury, poisoning and certain other consequences of external causes complicating pregnancy, first trimester: Secondary | ICD-10-CM | POA: Insufficient documentation

## 2022-12-20 DIAGNOSIS — O26891 Other specified pregnancy related conditions, first trimester: Secondary | ICD-10-CM | POA: Diagnosis present

## 2022-12-20 MED ORDER — LIDOCAINE 5 % EX PTCH
1.0000 | MEDICATED_PATCH | CUTANEOUS | Status: DC
  Administered 2022-12-20: 1 via TRANSDERMAL
  Filled 2022-12-20: qty 1

## 2022-12-20 MED ORDER — ACETAMINOPHEN 500 MG PO TABS
1000.0000 mg | ORAL_TABLET | Freq: Once | ORAL | Status: AC
  Administered 2022-12-20: 1000 mg via ORAL
  Filled 2022-12-20: qty 2

## 2022-12-20 MED ORDER — LIDOCAINE 4 % EX PTCH: 1.0000 | MEDICATED_PATCH | CUTANEOUS | 0 refills | Status: AC
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
## 2022-12-24 ENCOUNTER — Ambulatory Visit
Admission: EM | Admit: 2022-12-24 | Discharge: 2022-12-24 | Disposition: A | Discharge status: Home / Self Care | Payer: Medicaid Other | Attending: Internal Medicine | Admitting: Internal Medicine

## 2022-12-24 DIAGNOSIS — J069 Acute upper respiratory infection, unspecified: Secondary | ICD-10-CM | POA: Diagnosis not present

## 2022-12-24 MED ORDER — ALBUTEROL SULFATE HFA 108 (90 BASE) MCG/ACT IN AERS: 1.0000 | INHALATION_SPRAY | Freq: Four times a day (QID) | RESPIRATORY_TRACT | 0 refills | Status: DC | PRN

## 2022-12-24 MED ORDER — GUAIFENESIN 200 MG PO TABS: 200.0000 mg | ORAL_TABLET | ORAL | 0 refills | Status: DC | PRN

## 2022-12-24 MED ORDER — FLUTICASONE PROPIONATE 50 MCG/ACT NA SUSP
1.0000 | Freq: Every day | NASAL | 0 refills | Status: DC
Start: 2022-12-24 — End: 2023-10-19

## 2022-12-24 NOTE — ED Provider Notes (Signed)
[facility]    CSN: [id number] Arrival date & time: 12/24/22  1230      History   Chief Complaint Chief Complaint  Patient presents with   Nasal Congestion    HPI Janice Nicholson is a 22 y.o. female.   Patient presents with nasal congestion, cough, nausea, fatigue that started about 5 days ago.  Her family member has similar symptoms.  Denies chest pain, shortness of breath, diarrhea, abdominal pain.  Patient not sure of Tmax at home but states that she has felt feverish.  Reports history of asthma but does not have albuterol inhaler at home.  Patient states that she is currently [redacted] weeks pregnant.  Has been taking Robitussin and over-the-counter medications with minimal improvement in symptoms.  Reports having difficulty keeping fluids down given nausea.     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   Generalized anxiety disorder 03/10/2016   Adjustment disorder with depressed mood 03/10/2016    Past Surgical History:  Procedure Laterality Date   ADENOIDECTOMY     CHOLECYSTECTOMY N/A 07/23/2022   Procedure: LAPAROSCOPIC CHOLECYSTECTOMY WITH INTRAOPERATIVE CHOLANGIOGRAM;  Surgeon: Jovita Kussmaul, MD;  Location: [facility];  Service: General;  Laterality: N/A;   TONSILLECTOMY AND ADENOIDECTOMY      OB History     Gravida  1   Para      Term      Preterm      AB      Living         SAB      IAB      Ectopic      Multiple      Live Births               Home Medications     Prior to Admission medications   Medication Sig Start Date End Date Taking? Authorizing Provider  albuterol (VENTOLIN HFA) 108 (90 Base) MCG/ACT inhaler Inhale 1-2 puffs into the lungs every 6 (six) hours as needed for wheezing or shortness of breath. 12/24/22  Yes Draper Gallon, Hildred Alamin E, FNP  fluticasone (FLONASE) 50 MCG/ACT nasal spray Place 1 spray into both nostrils daily. 12/24/22  Yes Vester Balthazor, Hildred Alamin E, FNP  guaiFENesin 200 MG tablet Take 1 tablet (200 mg total) by mouth every 4 (four) hours as needed for cough or to loosen phlegm. 12/24/22  Yes Crislyn Willbanks, Michele Rockers, FNP  acetaminophen (TYLENOL) 325 MG tablet Take 325-650 mg by mouth every 6 (six) hours as needed for mild pain, headache or fever.    [provider]  cetirizine (ZYRTEC) 10 MG tablet Take 1 tablet (10 mg total) by mouth daily. Patient taking differently: Take 10 mg by mouth daily as needed for allergies or rhinitis. 02/01/22 07/22/22  Eulogio Bear, NP  dicyclomine (BENTYL) 20 MG tablet Take 1 tablet (20 mg total) by mouth 2 (two) times daily. 07/21/22   Francene Finders, PA-C  docusate sodium (COLACE) 100 MG capsule Take 1 capsule (100 mg total) by mouth 2 (two) times daily as needed  for mild constipation or moderate constipation. 07/25/22   Winferd Humphrey, PA-C  ibuprofen (ADVIL) 600 MG tablet Take 1 tablet (600 mg total) by mouth every 6 (six) hours as needed. 07/25/22   Winferd Humphrey, PA-C  Investigational - Study Medication Inhale 2 puffs into the lungs See admin instructions. Hailie study inhalers "AV007- Batura" [phone] or [phone]) Inhale 2 puffs into the lungs every 4 hours as needed for wheezing or shortness of breath Additional study details: Study started in early 06/2022 and will last through approx 08/2022. Med ID [id number]; Batch [id number] Patient not taking: Reported on 07/22/2022    [provider]  lidocaine 4 % Place 1 patch onto the skin daily for 10 doses. 12/20/22 12/30/22  Curatolo, Adam, DO  phenol  (CHLORASEPTIC) 1.4 % LIQD Use as directed 1 spray in the mouth or throat as needed for throat irritation / pain. 05/12/22   Lamptey, Myrene Galas, MD    Family History History reviewed. No pertinent family history.  Social History Social History   Tobacco Use   Smoking status: Never    Passive exposure: Yes   Smokeless tobacco: Never  Vaping Use   Vaping Use: Former  Substance Use Topics   Alcohol use: Yes   Drug use: Never     Allergies   Lactose intolerance (gi), Eggs or egg-derived products, Molds & smuts, and Penicillins   Review of Systems Review of Systems Per HPI  Physical Exam Triage Vital Signs ED Triage Vitals  Enc Vitals Group     BP 12/24/22 1305 122/70     Pulse Rate 12/24/22 1305 88     Resp 12/24/22 1305 18     Temp 12/24/22 1305 98.4 F (36.9 C)     Temp Source 12/24/22 1305 Oral     SpO2 12/24/22 1305 97 %     Weight --      Height --      Head Circumference --      Peak Flow --      Pain Score 12/24/22 1306 8     Pain Loc --      Pain Edu? --      Excl. in [place]? --    No data found.  Updated Vital Signs BP 122/70   Pulse 88   Temp 98.4 F (36.9 C) (Oral)   Resp 18   LMP 06/29/2022 (Approximate)   SpO2 97%   Visual Acuity Right Eye Distance:   Left Eye Distance:   Bilateral Distance:    Right Eye Near:   Left Eye Near:    Bilateral Near:     Physical Exam Constitutional:      General: She is not in acute distress.    Appearance: Normal appearance. She is not toxic-appearing or diaphoretic.  HENT:     Head: Normocephalic and atraumatic.     Right Ear: Tympanic membrane and ear canal normal.     Left Ear: Tympanic membrane and ear canal normal.     Nose: Congestion present.     Mouth/Throat:     Mouth: Mucous membranes are moist.     Pharynx: No posterior oropharyngeal erythema.  Eyes:     Extraocular Movements: Extraocular movements intact.     Conjunctiva/sclera: Conjunctivae normal.     Pupils: Pupils are equal, round, and  reactive to light.  Cardiovascular:     Rate and Rhythm: Normal rate and regular rhythm.     Pulses: Normal pulses.     Heart  sounds: Normal heart sounds.  Pulmonary:     Effort: Pulmonary effort is normal. No respiratory distress.     Breath sounds: Normal breath sounds. No stridor. No wheezing, rhonchi or rales.  Abdominal:     General: Abdomen is flat. Bowel sounds are normal. There is no distension.     Palpations: Abdomen is soft.     Tenderness: There is no abdominal tenderness.  Musculoskeletal:        General: Normal range of motion.     Cervical back: Normal range of motion.  Skin:    General: Skin is warm and dry.  Neurological:     General: No focal deficit present.     Mental Status: She is alert and oriented to person, place, and time. Mental status is at baseline.  Psychiatric:        Mood and Affect: Mood normal.        Behavior: Behavior normal.      UC Treatments / Results  Labs (all labs ordered are listed, but only abnormal results are displayed) Labs Reviewed - No data to display  EKG   Radiology No results found.  Procedures Procedures (including critical care time)  Medications Ordered in UC Medications - No data to display  Initial Impression / Assessment and Plan / UC Course  I have reviewed the triage vital signs and the nursing notes.  Pertinent labs & imaging results that were available during my care of the patient were reviewed by me and considered in my medical decision making (see chart for details).     Patient presents with symptoms likely from a viral upper respiratory infection. Do not suspect underlying cardiopulmonary process. Symptoms seem unlikely related to ACS, CHF or COPD exacerbations, pneumonia, pneumothorax. Patient is nontoxic appearing and not in need of emergent medical intervention.  Patient declined COVID testing given negative COVID test at home.  Do not have flu testing capabilities here in urgent  care.  Recommended symptom control with medications and supportive care that are safe for pregnancy.  Patient sent medications.  There are no signs of dehydration on exam at this time but patient was advised to go to the maternity assessment unit at [facility] if she continues to have difficulty keeping fluid down.  Patient states understanding and is agreeable.  Discharged with PCP followup.  Final Clinical Impressions(s) / UC Diagnoses   Final diagnoses:  Viral upper respiratory tract infection with cough     Discharge Instructions      It appears that you have a viral illness that should run its course and self resolve with symptomatic treatment as we discussed.  I have prescribed you 3 medications including refill on your inhaler that is safe with pregnancy.  Recommend that you follow-up if symptoms persist or worsen.  You may also go to the maternity assessment unit if you continue  to not be able to keep fluids down.    ED Prescriptions     Medication Sig Dispense Auth. Provider   albuterol (VENTOLIN HFA) 108 (90 Base) MCG/ACT inhaler Inhale 1-2 puffs into the lungs every 6 (six) hours as needed for wheezing or shortness of breath. 1 each [place], Hildred Alamin E, FNP   guaiFENesin 200 MG tablet Take 1 tablet (200 mg total) by mouth every 4 (four) hours as needed for cough or to loosen phlegm. 30 tablet [place], [place]   fluticasone [facility]) 50 MCG/ACT nasal spray Place 1 spray into both nostrils daily. 16 g  Gustavus Bryant, [place]      PDMP not reviewed this encounter.   Gustavus Bryant, [place] 12/24/22 1336

## 2022-12-24 NOTE — Discharge Instructions (Signed)
It appears that you have a viral illness that should run its course and self resolve with symptomatic treatment as we discussed.  I have prescribed you 3 medications including refill on your inhaler that is safe with pregnancy.  Recommend that you follow-up if symptoms persist or worsen.  You may also go to the maternity assessment unit if you continue  to not be able to keep fluids down.

## 2022-12-24 NOTE — ED Triage Notes (Signed)
Pt c/o nasal congestion with drainage, sore throat, cough, fatigue, malaise, lack of appetite,   Onset ~ sat   Currently pregnant

## 2023-01-08 LAB — HEPATITIS C ANTIBODY: HCV Ab: NEGATIVE

## 2023-01-08 LAB — OB RESULTS CONSOLE ABO/RH: RH Type: POSITIVE

## 2023-01-08 LAB — OB RESULTS CONSOLE HEPATITIS B SURFACE ANTIGEN: Hepatitis B Surface Ag: NEGATIVE

## 2023-01-08 LAB — OB RESULTS CONSOLE ANTIBODY SCREEN: Antibody Screen: NEGATIVE

## 2023-01-08 LAB — OB RESULTS CONSOLE GC/CHLAMYDIA
Chlamydia: NEGATIVE
Neisseria Gonorrhea: NEGATIVE

## 2023-01-08 LAB — OB RESULTS CONSOLE RUBELLA ANTIBODY, IGM: Rubella: IMMUNE

## 2023-01-08 LAB — OB RESULTS CONSOLE RPR: RPR: NONREACTIVE

## 2023-01-08 LAB — OB RESULTS CONSOLE HIV ANTIBODY (ROUTINE TESTING): HIV: NONREACTIVE

## 2023-02-19 ENCOUNTER — Other Ambulatory Visit: Payer: Self-pay | Admitting: Certified Nurse Midwife

## 2023-02-19 ENCOUNTER — Encounter (HOSPITAL_COMMUNITY): Payer: Self-pay | Admitting: Obstetrics & Gynecology

## 2023-02-19 ENCOUNTER — Inpatient Hospital Stay (HOSPITAL_COMMUNITY)
Admission: AD | Admit: 2023-02-19 | Discharge: 2023-02-19 | Disposition: A | Discharge status: Home / Self Care | Payer: Medicaid Other | Attending: Obstetrics & Gynecology | Admitting: Obstetrics & Gynecology

## 2023-02-19 ENCOUNTER — Ambulatory Visit
Admission: EM | Admit: 2023-02-19 | Discharge: 2023-02-19 | Disposition: A | Discharge status: Home / Self Care | Payer: Medicaid Other

## 2023-02-19 DIAGNOSIS — J0100 Acute maxillary sinusitis, unspecified: Secondary | ICD-10-CM | POA: Diagnosis not present

## 2023-02-19 DIAGNOSIS — J069 Acute upper respiratory infection, unspecified: Secondary | ICD-10-CM | POA: Diagnosis not present

## 2023-02-19 DIAGNOSIS — R112 Nausea with vomiting, unspecified: Secondary | ICD-10-CM

## 2023-02-19 DIAGNOSIS — O99512 Diseases of the respiratory system complicating pregnancy, second trimester: Secondary | ICD-10-CM | POA: Insufficient documentation

## 2023-02-19 DIAGNOSIS — J45901 Unspecified asthma with (acute) exacerbation: Secondary | ICD-10-CM | POA: Insufficient documentation

## 2023-02-19 DIAGNOSIS — Z79899 Other long term (current) drug therapy: Secondary | ICD-10-CM | POA: Insufficient documentation

## 2023-02-19 DIAGNOSIS — B348 Other viral infections of unspecified site: Secondary | ICD-10-CM

## 2023-02-19 DIAGNOSIS — O98512 Other viral diseases complicating pregnancy, second trimester: Secondary | ICD-10-CM | POA: Insufficient documentation

## 2023-02-19 DIAGNOSIS — Z3A18 18 weeks gestation of pregnancy: Secondary | ICD-10-CM | POA: Diagnosis not present

## 2023-02-19 DIAGNOSIS — Z7951 Long term (current) use of inhaled steroids: Secondary | ICD-10-CM | POA: Diagnosis not present

## 2023-02-19 LAB — RESPIRATORY PANEL BY PCR

## 2023-02-19 LAB — URINALYSIS, ROUTINE W REFLEX MICROSCOPIC
Bilirubin Urine: NEGATIVE
Glucose, UA: NEGATIVE mg/dL
Hgb urine dipstick: NEGATIVE
Ketones, ur: NEGATIVE mg/dL
Leukocytes,Ua: NEGATIVE
Nitrite: NEGATIVE
Protein, ur: NEGATIVE mg/dL
Specific Gravity, Urine: 1.014 (ref 1.005–1.030)
pH: 7 (ref 5.0–8.0)

## 2023-02-19 MED ORDER — SALINE SPRAY 0.65 % NA SOLN: 2.0000 | NASAL | 0 refills | Status: DC | PRN

## 2023-02-19 MED ORDER — IPRATROPIUM-ALBUTEROL 0.5-2.5 (3) MG/3ML IN SOLN
3.0000 mL | Freq: Once | RESPIRATORY_TRACT | Status: AC
  Administered 2023-02-19: 3 mL via RESPIRATORY_TRACT
  Filled 2023-02-19: qty 9

## 2023-02-19 MED ORDER — PHENOL 1.4 % MT LIQD
1.0000 | OROMUCOSAL | Status: DC | PRN
Start: 2023-02-19 — End: 2023-02-19
  Administered 2023-02-19: 1 via OROMUCOSAL
  Filled 2023-02-19: qty 177

## 2023-02-19 MED ORDER — LORATADINE 10 MG PO TABS: 10.0000 mg | ORAL_TABLET | Freq: Every day | ORAL | 1 refills | Status: DC

## 2023-02-19 MED ORDER — MENTHOL 3 MG MT LOZG
1.0000 | LOZENGE | OROMUCOSAL | Status: DC | PRN
  Administered 2023-02-19: 3 mg via ORAL
  Filled 2023-02-19: qty 9

## 2023-02-19 MED ORDER — ALBUTEROL SULFATE HFA 108 (90 BASE) MCG/ACT IN AERS: 1.0000 | INHALATION_SPRAY | Freq: Four times a day (QID) | RESPIRATORY_TRACT | 0 refills | Status: DC | PRN

## 2023-02-19 MED ORDER — LORATADINE 10 MG PO TABS
10.0000 mg | ORAL_TABLET | Freq: Every day | ORAL | Status: DC
Start: 2023-02-19 — End: 2023-02-19
  Administered 2023-02-19: 10 mg via ORAL
  Filled 2023-02-19: qty 1

## 2023-02-19 MED ORDER — FLUTICASONE PROPIONATE 50 MCG/ACT NA SUSP
2.0000 | Freq: Every day | NASAL | Status: DC
  Administered 2023-02-19: 2 via NASAL
  Filled 2023-02-19: qty 16

## 2023-02-19 MED ORDER — BUDESONIDE 0.25 MG/2ML IN SUSP: 0.2500 mg | Freq: Every day | RESPIRATORY_TRACT | 12 refills | Status: DC

## 2023-02-19 NOTE — Discharge Instructions (Signed)
Please go to the maternity assessment unit at [facility] for further evaluation and management.

## 2023-02-19 NOTE — MAU Provider Note (Signed)
History     CSN: [id number]  Arrival date and time: 02/19/23 1404   Event Date/Time   First Provider Initiated Contact with Patient 02/19/23 1603      No chief complaint on file.  Janice Nicholson , a  22 y.o. G1P0 at [redacted]w[redacted]d presents to MAU after being sent over from Urgent care for nasal congestion, cough and nausea and vomiting. Patient presents drinking a Large Dr. Malachi Bonds and requesting some crackers. Patient states for the last 4 days she has been coughing up mucus/ phlegm and has been experiencing some sinus pressure and nasal congestion. She also reports a mild headache around her eyes.  She states she has been coughing so much that her throat is now sore and "raw." She states that sometimes she coughs so hard she throws up. She endorses having asthma and states "my nebulizer and inhaler are not working." Last use was last night at 1am. She states she also has attempted Mucinex DM without relief.  She denies having a fever and chills or anyone else in her home sick . She does endorse some body aches but states she believes it is from coughing so hard. She denies vaginal bleeding, leaking of fluid. She endorses feeling occasional flutters.          OB History     Gravida  1   Para      Term      Preterm      AB      Living         SAB      IAB      Ectopic      Multiple      Live Births              Past Medical History:  Diagnosis Date   Asthma    Attention deficit hyperactivity disorder (ADHD)    Environmental allergies    Lactose intolerance     Past Surgical History:  Procedure Laterality Date   ADENOIDECTOMY     CHOLECYSTECTOMY N/A 07/23/2022   Procedure: LAPAROSCOPIC CHOLECYSTECTOMY WITH INTRAOPERATIVE CHOLANGIOGRAM;  Surgeon: Jovita Kussmaul, MD;  Location: [facility];  Service: General;  Laterality: N/A;   TONSILLECTOMY AND ADENOIDECTOMY      History reviewed. No pertinent family history.  Social History   Tobacco Use   Smoking status: Never     Passive exposure: Yes   Smokeless tobacco: Never  Vaping Use   Vaping Use: Former  Substance Use Topics   Alcohol use: Yes   Drug use: Never    Allergies:  Allergies  Allergen Reactions   Lactose Intolerance (Gi) Diarrhea and Rash   Egg-Derived Products Nausea And Vomiting   Molds & Smuts Other (See Comments)    Triggers asthma   Penicillins Other (See Comments)    Family history of this- Almost killed the patient's mother and grandmother    Medications Prior to Admission  Medication Sig Dispense Refill Last Dose   acetaminophen (TYLENOL) 325 MG tablet Take 325-650 mg by mouth every 6 (six) hours as needed for mild pain, headache or fever.   02/18/2023 at 2200   albuterol (VENTOLIN HFA) 108 (90 Base) MCG/ACT inhaler Inhale 1-2 puffs into the lungs every 6 (six) hours as needed for wheezing or shortness of breath. 1 each 0 02/19/2023   guaiFENesin 200 MG tablet Take 1 tablet (200 mg total) by mouth every 4 (four) hours as needed for cough or to loosen phlegm. 30 tablet 0  02/18/2023   cetirizine (ZYRTEC) 10 MG tablet Take 1 tablet (10 mg total) by mouth daily. (Patient taking differently: Take 10 mg by mouth daily as needed for allergies or rhinitis.) 30 tablet 0    dicyclomine (BENTYL) 20 MG tablet Take 1 tablet (20 mg total) by mouth 2 (two) times daily. 20 tablet 0    docusate sodium (COLACE) 100 MG capsule Take 1 capsule (100 mg total) by mouth 2 (two) times daily as needed for mild constipation or moderate constipation. 10 capsule 0    fluticasone (FLONASE) 50 MCG/ACT nasal spray Place 1 spray into both nostrils daily. 16 g 0    ibuprofen (ADVIL) 600 MG tablet Take 1 tablet (600 mg total) by mouth every 6 (six) hours as needed. 30 tablet 0    Investigational - Study Medication Inhale 2 puffs into the lungs See admin instructions. Hailie study inhalers "AV007- Batura" [phone] or [phone]) Inhale 2 puffs into the lungs every 4 hours as needed for wheezing or shortness of  breath Additional study details: Study started in early 06/2022 and will last through approx 08/2022. Med ID [id number]; Batch [phone] (Patient not taking: Reported on 07/22/2022)      phenol (CHLORASEPTIC) 1.4 % LIQD Use as directed 1 spray in the mouth or throat as needed for throat irritation / pain.  0     Review of Systems  Constitutional:  Negative for chills, fatigue and fever.  Eyes:  Negative for pain and visual disturbance.  Respiratory:  Positive for cough and shortness of breath. Negative for apnea and wheezing.   Cardiovascular:  Negative for chest pain and palpitations.  Gastrointestinal:  Positive for vomiting. Negative for abdominal pain, constipation, diarrhea and nausea.  Genitourinary:  Negative for difficulty urinating, dysuria, pelvic pain, vaginal bleeding, vaginal discharge and vaginal pain.  Musculoskeletal:  Positive for myalgias. Negative for back pain.  Allergic/Immunologic: Positive for environmental allergies.  Neurological:  Negative for seizures, weakness and headaches.  Psychiatric/Behavioral:  Negative for suicidal ideas.    Physical Exam   Blood pressure (!) 109/46, pulse 82, temperature 98.4 F (36.9 C), temperature source Oral, resp. rate 16, height 5\' 5"  (1.651 m), weight 102.2 kg, last menstrual period 06/29/2022, SpO2 99 %.  Physical Exam Vitals and nursing note reviewed.  Constitutional:      General: She is not in acute distress.    Appearance: Normal appearance.  HENT:     Head: Normocephalic.     Comments: Facial tenderness bilaterally underneath eyes to maxillary sinuses.  Cardiovascular:     Rate and Rhythm: Normal rate and regular rhythm.  Pulmonary:     Breath sounds: Wheezing present.     Comments: Patient has wheezes but with coughing clears and breath sounds normal  Abdominal:     Palpations: Abdomen is soft.  Musculoskeletal:        General: Normal range of motion.     Cervical back: Normal range of motion.  Skin:    General: Skin  is warm and dry.     Capillary Refill: Capillary refill takes less than 2 seconds.  Neurological:     Mental Status: She is alert and oriented to person, place, and time.  Psychiatric:        Mood and Affect: Mood normal.    FHT obtained in triage.   MAU Course  Procedures Orders Placed This Encounter  Procedures   Respiratory (~20 pathogens) panel by PCR   Urinalysis, Routine w reflex microscopic -Urine, Clean Catch  Droplet precaution   Results for orders placed or performed during the hospital encounter of 02/19/23 (from the past 24 hour(s))  Urinalysis, Routine w reflex microscopic -Urine, Clean Catch     Status: None   Collection Time: 02/19/23  2:27 PM  Result Value Ref Range   Color, Urine YELLOW YELLOW   APPearance CLEAR CLEAR   Specific Gravity, Urine 1.014 1.005 - 1.030   pH 7.0 5.0 - 8.0   Glucose, UA NEGATIVE NEGATIVE mg/dL   Hgb urine dipstick NEGATIVE NEGATIVE   Bilirubin Urine NEGATIVE NEGATIVE   Ketones, ur NEGATIVE NEGATIVE mg/dL   Protein, ur NEGATIVE NEGATIVE mg/dL   Nitrite NEGATIVE NEGATIVE   Leukocytes,Ua NEGATIVE NEGATIVE  Respiratory (~20 pathogens) panel by PCR     Status: Abnormal   Collection Time: 02/19/23  2:49 PM   Specimen: Nasopharyngeal Swab; Respiratory  Result Value Ref Range   Adenovirus NOT DETECTED NOT DETECTED   Coronavirus 229E NOT DETECTED NOT DETECTED   Coronavirus HKU1 NOT DETECTED NOT DETECTED   Coronavirus NL63 NOT DETECTED NOT DETECTED   Coronavirus OC43 NOT DETECTED NOT DETECTED   Metapneumovirus NOT DETECTED NOT DETECTED   Rhinovirus / Enterovirus DETECTED (A) NOT DETECTED   Influenza A NOT DETECTED NOT DETECTED   Influenza B NOT DETECTED NOT DETECTED   Parainfluenza Virus 1 NOT DETECTED NOT DETECTED   Parainfluenza Virus 2 NOT DETECTED NOT DETECTED   Parainfluenza Virus 3 NOT DETECTED NOT DETECTED   Parainfluenza Virus 4 NOT DETECTED NOT DETECTED   Respiratory Syncytial Virus NOT DETECTED NOT DETECTED   Bordetella  pertussis NOT DETECTED NOT DETECTED   Bordetella Parapertussis NOT DETECTED NOT DETECTED   Chlamydophila pneumoniae NOT DETECTED NOT DETECTED   Mycoplasma pneumoniae NOT DETECTED NOT DETECTED    MDM - UA normal. Low suspicion for Dehydration  - Resp Panel positive for Rhinovirus  - Given sinus pressure and nasal congestion with facial tenderness and headache suspicion for Sinus infection as well.  - Reassessment at 1639- patient reports duo neb treatment "helped so much!"  - plan for discharge  Assessment and Plan   1. Rhinovirus infection   2. Acute maxillary sinusitis, recurrence not specified   3. [redacted] weeks gestation of pregnancy   4. Asthma exacerbation, mild    - reviewed that common cold is a virus and has to run its course. Treatment based on symptoms.  - Reviewed OTC options that are safe in pregnancy.  - Worsening signs and return precautions reviewed.  - Rx for inhaler refill sent to  outpatient pharmacy.  - Patient discharged home in stable condition and may return to MAU as needed.   Jacquiline Doe, MSN CNM  02/19/2023, 4:03 PM

## 2023-02-19 NOTE — MAU Note (Signed)
Janice Nicholson is a 22 y.o. at [redacted]w[redacted]d here in MAU reporting: went to Urgent Care, has acute bronchitis, and was asking for antibioitcs.  Was not given anything, was just sent over here.  Symptoms:is using a nebulizer, her inhaler and taking Mucinex DM.  Symptoms aren't getting better, is dehydrated.  Every time she coughs it gags her and makes her throw up. Doesn't think she has a fever.  Nose is stuffy, throat hurts- is very raw, chest and lungs are hurting. Body aches. Pain in lower stomach, hurts when she coughs.  Onset of complaint: 4days ago Pain score: throat 8, body 7, chest Vitals:   02/19/23 1426 02/19/23 1428  BP:  128/61  Pulse:  83  Resp:  16  Temp:  98.4 F (36.9 C)  SpO2: 100% 98%     FHT:164 Lab orders placed from triage:

## 2023-02-19 NOTE — ED Provider Notes (Signed)
[facility]    CSN: [id number] Arrival date & time: 02/19/23  1207      History   Chief Complaint No chief complaint on file.   HPI Janice Nicholson is a 22 y.o. female.   Patient presents with cough, shortness of breath, runny nose, nasal congestion, nausea, vomiting that started about 4 days ago.  Patient has taken Mucinex DM for symptoms.  She is also using albuterol inhaler and nebulizer as she has asthma with minimal improvement in symptoms.  She has been having intermittent shortness of breath and wheezing.  Reports nausea and vomiting that has been present since symptoms started and she has not been able to keep any food or fluids down.  She is very concerned about dehydration.  Reports she is currently 4 months pregnant.  Denies any known sick contacts or fevers.     Past Medical History:  Diagnosis Date   Asthma    Attention deficit hyperactivity disorder (ADHD)    Environmental allergies    Lactose intolerance     Patient Active Problem List   Diagnosis Date Noted   Acute biliary pancreatitis 07/24/2022   Gallstone pancreatitis 07/22/2022   BMI (body mass index), pediatric, 95-99% for age 31/28/2020   Moderate persistent asthma without complication [id number]   Primary insomnia 08/22/2019   ADHD (attention deficit hyperactivity disorder), combined type 03/10/2016   Generalized anxiety disorder 03/10/2016   Adjustment disorder with depressed mood 03/10/2016    Past Surgical History:  Procedure Laterality Date   ADENOIDECTOMY     CHOLECYSTECTOMY N/A 07/23/2022   Procedure: LAPAROSCOPIC CHOLECYSTECTOMY WITH INTRAOPERATIVE CHOLANGIOGRAM;  Surgeon: Jovita Kussmaul, MD;  Location: [facility];  Service: General;  Laterality: N/A;   TONSILLECTOMY AND ADENOIDECTOMY      OB History     Gravida  1   Para      Term      Preterm      AB      Living         SAB      IAB      Ectopic      Multiple      Live Births               Home  Medications    Prior to Admission medications   Medication Sig Start Date End Date Taking? Authorizing Provider  albuterol (VENTOLIN HFA) 108 (90 Base) MCG/ACT inhaler Inhale 1-2 puffs into the lungs every 6 (six) hours as needed for wheezing or shortness of breath. 12/24/22  Yes Mele Sylvester, Hildred Alamin E, FNP  fluticasone (FLONASE) 50 MCG/ACT nasal spray Place 1 spray into both nostrils daily. 12/24/22  Yes Weber Monnier, Hildred Alamin E, FNP  guaiFENesin 200 MG tablet Take 1 tablet (200 mg total) by mouth every 4 (four) hours as needed for cough or to loosen phlegm. 12/24/22  Yes Kaliel Bolds, Michele Rockers, FNP  acetaminophen (TYLENOL) 325 MG tablet Take 325-650 mg by mouth every 6 (six) hours as needed for mild pain, headache or fever.    [provider]  cetirizine (ZYRTEC) 10 MG tablet Take 1 tablet (10 mg total) by mouth daily. Patient taking differently: Take 10 mg by mouth daily as needed for allergies or rhinitis. 02/01/22 07/22/22  Eulogio Bear, NP  dicyclomine (BENTYL) 20 MG tablet Take 1 tablet (20 mg total) by mouth 2 (two) times daily. 07/21/22   Francene Finders, PA-C  docusate sodium (COLACE) 100 MG capsule Take 1 capsule (100 mg total) by  mouth 2 (two) times daily as needed for mild constipation or moderate constipation. 07/25/22   Winferd Humphrey, PA-C  ibuprofen (ADVIL) 600 MG tablet Take 1 tablet (600 mg total) by mouth every 6 (six) hours as needed. 07/25/22   Winferd Humphrey, PA-C  Investigational - Study Medication Inhale 2 puffs into the lungs See admin instructions. Hailie study inhalers "AV007- Batura" [phone] or [phone]) Inhale 2 puffs into the lungs every 4 hours as needed for wheezing or shortness of breath Additional study details: Study started in early 06/2022 and will last through approx 08/2022. Med ID [id number]; Batch [id number] Patient not taking: Reported on 07/22/2022    [provider]  phenol (CHLORASEPTIC) 1.4 % LIQD Use as directed 1 spray in the mouth or throat as needed  for throat irritation / pain. 05/12/22   Lamptey, Myrene Galas, MD    Family History History reviewed. No pertinent family history.  Social History Social History   Tobacco Use   Smoking status: Never    Passive exposure: Yes   Smokeless tobacco: Never  Vaping Use   Vaping Use: Former  Substance Use Topics   Alcohol use: Yes   Drug use: Never     Allergies   Lactose intolerance (gi), Egg-derived products, Molds & smuts, and Penicillins   Review of Systems Review of Systems Per HPI  Physical Exam Triage Vital Signs ED Triage Vitals  Enc Vitals Group     BP 02/19/23 1253 111/68     Pulse Rate 02/19/23 1253 80     Resp 02/19/23 1253 16     Temp 02/19/23 1253 98.2 F (36.8 C)     Temp Source 02/19/23 1253 Oral     SpO2 02/19/23 1253 97 %     Weight --      Height --      Head Circumference --      Peak Flow --      Pain Score 02/19/23 1256 0     Pain Loc --      Pain Edu? --      Excl. in [place]? --    No data found.  Updated Vital Signs BP 111/68 (BP Location: Left Arm)   Pulse 80   Temp 98.2 F (36.8 C) (Oral)   Resp 16   LMP 06/29/2022 (Approximate)   SpO2 97%   Visual Acuity Right Eye Distance:   Left Eye Distance:   Bilateral Distance:    Right Eye Near:   Left Eye Near:    Bilateral Near:     Physical Exam Constitutional:      General: She is not in acute distress.    Appearance: Normal appearance. She is not toxic-appearing or diaphoretic.  HENT:     Head: Normocephalic and atraumatic.     Nose: Congestion present.     Mouth/Throat:     Mouth: Mucous membranes are moist.     Pharynx: No posterior oropharyngeal erythema.  Eyes:     Extraocular Movements: Extraocular movements intact.     Conjunctiva/sclera: Conjunctivae normal.     Pupils: Pupils are equal, round, and reactive to light.  Cardiovascular:     Rate and Rhythm: Normal rate and regular rhythm.     Pulses: Normal pulses.     Heart sounds: Normal heart sounds.  Pulmonary:      Effort: Pulmonary effort is normal. No respiratory distress.     Breath sounds: No stridor. Wheezing present. No rhonchi or rales.  Abdominal:  General: Abdomen is flat. Bowel sounds are normal.     Palpations: Abdomen is soft.  Musculoskeletal:        General: Normal range of motion.     Cervical back: Normal range of motion.  Skin:    General: Skin is warm and dry.  Neurological:     General: No focal deficit present.     Mental Status: She is alert and oriented to person, place, and time. Mental status is at baseline.  Psychiatric:        Mood and Affect: Mood normal.        Behavior: Behavior normal.      UC Treatments / Results  Labs (all labs ordered are listed, but only abnormal results are displayed) Labs Reviewed - No data to display  EKG   Radiology No results found.  Procedures Procedures (including critical care time)  Medications Ordered in UC Medications - No data to display  Initial Impression / Assessment and Plan / UC Course  I have reviewed the triage vital signs and the nursing notes.  Pertinent labs & imaging results that were available during my care of the patient were reviewed by me and considered in my medical decision making (see chart for details).     Patient most likely has a viral upper respiratory infection causing exacerbation of her asthma but I am very concerned given the patient has not been able to keep any food or fluids down and that she is currently pregnant.  I am also concerned given that nebulizer treatments have not been helpful with her wheezing and she still has significant wheezing on auscultation.  Given all these factors, I do think that more extensive evaluation and management is necessary at the maternity assessment unit at [facility] emergency department today.  Advised patient to go there for further evaluation and management.  She was agreeable with plan.  Vital signs are stable and there is no tachypnea so do  not think that EMS transport is necessary.  Agree with patient self transport to the ER. Final Clinical Impressions(s) / UC Diagnoses   Final diagnoses:  Asthma with acute exacerbation, unspecified asthma severity, unspecified whether persistent  Viral upper respiratory tract infection with cough  Nausea and vomiting, unspecified vomiting type     Discharge Instructions      Please go to the maternity assessment unit at [facility] for further evaluation and management.    ED Prescriptions   None    PDMP not reviewed this encounter.   Teodora Medici, [place] 02/19/23 1316

## 2023-02-19 NOTE — ED Triage Notes (Signed)
Pt is herr for cough, SOB ,runny nose, chest congestion , nasal congestion, sore throat, neck pain, x 4 days . Pt has tried Mucinex dm .

## 2023-02-19 NOTE — MAU Note (Signed)
Notified NICU Respiratory Therapist of DuoNeb order.

## 2023-03-09 DIAGNOSIS — F1290 Cannabis use, unspecified, uncomplicated: Secondary | ICD-10-CM | POA: Insufficient documentation

## 2023-03-09 DIAGNOSIS — O99320 Drug use complicating pregnancy, unspecified trimester: Secondary | ICD-10-CM | POA: Insufficient documentation

## 2023-03-09 DIAGNOSIS — O09899 Supervision of other high risk pregnancies, unspecified trimester: Secondary | ICD-10-CM | POA: Insufficient documentation

## 2023-03-09 DIAGNOSIS — O99210 Obesity complicating pregnancy, unspecified trimester: Secondary | ICD-10-CM | POA: Insufficient documentation

## 2023-03-09 DIAGNOSIS — Z8279 Family history of other congenital malformations, deformations and chromosomal abnormalities: Secondary | ICD-10-CM | POA: Insufficient documentation

## 2023-03-10 ENCOUNTER — Encounter (HOSPITAL_COMMUNITY): Payer: Self-pay | Admitting: Obstetrics and Gynecology

## 2023-03-10 ENCOUNTER — Inpatient Hospital Stay (HOSPITAL_COMMUNITY)
Admission: AD | Admit: 2023-03-10 | Discharge: 2023-03-10 | Disposition: A | Discharge status: Home / Self Care | Payer: Medicaid Other | Attending: Obstetrics and Gynecology | Admitting: Obstetrics and Gynecology

## 2023-03-10 DIAGNOSIS — O219 Vomiting of pregnancy, unspecified: Secondary | ICD-10-CM | POA: Diagnosis not present

## 2023-03-10 DIAGNOSIS — O26892 Other specified pregnancy related conditions, second trimester: Secondary | ICD-10-CM | POA: Insufficient documentation

## 2023-03-10 DIAGNOSIS — R252 Cramp and spasm: Secondary | ICD-10-CM | POA: Diagnosis not present

## 2023-03-10 DIAGNOSIS — E860 Dehydration: Secondary | ICD-10-CM | POA: Insufficient documentation

## 2023-03-10 DIAGNOSIS — R112 Nausea with vomiting, unspecified: Secondary | ICD-10-CM

## 2023-03-10 DIAGNOSIS — Z3A21 21 weeks gestation of pregnancy: Secondary | ICD-10-CM

## 2023-03-10 LAB — URINALYSIS, ROUTINE W REFLEX MICROSCOPIC
Bilirubin Urine: NEGATIVE
Glucose, UA: NEGATIVE mg/dL
Hgb urine dipstick: NEGATIVE
Ketones, ur: NEGATIVE mg/dL
Leukocytes,Ua: NEGATIVE
Nitrite: NEGATIVE
Protein, ur: NEGATIVE mg/dL
Specific Gravity, Urine: 1.02 (ref 1.005–1.030)
pH: 7 (ref 5.0–8.0)

## 2023-03-10 LAB — COMPREHENSIVE METABOLIC PANEL
ALT: 13 U/L (ref 0–44)
AST: 26 U/L (ref 15–41)
Albumin: 3.2 g/dL — ABNORMAL LOW (ref 3.5–5.0)
Alkaline Phosphatase: 94 U/L (ref 38–126)
Anion gap: 10 (ref 5–15)
BUN: 5 mg/dL — ABNORMAL LOW (ref 6–20)
CO2: 24 mmol/L (ref 22–32)
Calcium: 9.2 mg/dL (ref 8.9–10.3)
Chloride: 103 mmol/L (ref 98–111)
Creatinine, Ser: 0.5 mg/dL (ref 0.44–1.00)
GFR, Estimated: >60 mL/min (ref >60)
Glucose, Bld: 78 mg/dL (ref 70–99)
Potassium: 3.5 mmol/L (ref 3.5–5.1)
Sodium: 137 mmol/L (ref 135–145)
Total Bilirubin: 0.4 mg/dL (ref 0.3–1.2)
Total Protein: 6.9 g/dL (ref 6.5–8.1)

## 2023-03-10 LAB — PROTEIN / CREATININE RATIO, URINE
Creatinine, Urine: 201 mg/dL
Protein Creatinine Ratio: 0.06 mg/mg{Cre} (ref 0.00–0.15)
Total Protein, Urine: 12 mg/dL

## 2023-03-10 LAB — CBC
HCT: 32.6 % — ABNORMAL LOW (ref 36.0–46.0)
Hemoglobin: 10.7 g/dL — ABNORMAL LOW (ref 12.0–15.0)
MCH: 27.2 pg (ref 26.0–34.0)
MCHC: 32.8 g/dL (ref 30.0–36.0)
MCV: 83 fL (ref 80.0–100.0)
Platelets: 351 10*3/uL (ref 150–400)
RBC: 3.93 MIL/uL (ref 3.87–5.11)
RDW: 14.3 % (ref 11.5–15.5)
WBC: 15.2 10*3/uL — ABNORMAL HIGH (ref 4.0–10.5)
nRBC: 0 % (ref 0.0–0.2)

## 2023-03-10 MED ORDER — METOCLOPRAMIDE HCL 5 MG/ML IJ SOLN
10.0000 mg | Freq: Once | INTRAMUSCULAR | Status: AC
  Administered 2023-03-10: 10 mg via INTRAVENOUS
  Filled 2023-03-10: qty 2

## 2023-03-10 MED ORDER — METOCLOPRAMIDE HCL 10 MG PO TABS: 10.0000 mg | ORAL_TABLET | Freq: Four times a day (QID) | ORAL | 0 refills | Status: DC | PRN

## 2023-03-10 MED ORDER — SODIUM CHLORIDE 0.9 % IV BOLUS
1000.0000 mL | Freq: Once | INTRAVENOUS | Status: AC
  Administered 2023-03-10: 1000 mL via INTRAVENOUS

## 2023-03-10 MED ORDER — ONDANSETRON 4 MG PO TBDP: 4.0000 mg | ORAL_TABLET | Freq: Four times a day (QID) | ORAL | 0 refills | Status: DC | PRN

## 2023-03-10 MED ORDER — PANTOPRAZOLE SODIUM 40 MG IV SOLR
40.0000 mg | Freq: Once | INTRAVENOUS | Status: AC
  Administered 2023-03-10: 40 mg via INTRAVENOUS
  Filled 2023-03-10: qty 10

## 2023-03-10 NOTE — MAU Provider Note (Cosign Needed Addendum)
History     CSN: [id number]  Arrival date and time: 03/10/23 1817   None     Chief Complaint  Patient presents with   Nausea   Emesis   Abdominal Pain   Collen Nicholson is a 22 y.o. G1P0 at [redacted]w[redacted]d who presents for several weeks of nausea and emesis. Over the last several days she has noticed intermittent tightening in her abdomen that makes her feel flushed and light headed. She gets her care [facility] the HD. She denies VB, LOF. She will occasionally feel fetal flutters.   OB History     Gravida  1   Para      Term      Preterm      AB      Living         SAB      IAB      Ectopic      Multiple      Live Births              Past Medical History:  Diagnosis Date   Acute biliary pancreatitis 07/24/2022   Adjustment disorder with depressed mood 03/10/2016   Asthma    Attention deficit hyperactivity disorder (ADHD)    Environmental allergies    Gallstone pancreatitis 07/22/2022   Generalized anxiety disorder 03/10/2016   Lactose intolerance    Moderate persistent asthma without complication 08/22/2019   Last Assessment & Plan:   Resume maintenance therapy, this time with Advair Diskus, as Flovent was ineffective historically.   Primary insomnia 08/22/2019   Last Assessment & Plan:   Managed by Renee Pain at [facility]   UTI (urinary tract infection)     Past Surgical History:  Procedure Laterality Date   ADENOIDECTOMY     CHOLECYSTECTOMY N/A 07/23/2022   Procedure: LAPAROSCOPIC CHOLECYSTECTOMY WITH INTRAOPERATIVE CHOLANGIOGRAM;  Surgeon: Griselda Miner, MD;  Location: [facility];  Service: General;  Laterality: N/A;   TONSILLECTOMY AND ADENOIDECTOMY      History reviewed. No pertinent family history.  Social History   Tobacco Use   Smoking status: Never    Passive exposure: Yes   Smokeless tobacco: Never  Vaping Use   Vaping Use: Former  Substance Use Topics   Alcohol use: Not Currently   Drug use: Not Currently    Types:  Marijuana    Comment: + 01/08/23    Allergies:  Allergies  Allergen Reactions   Lactose Intolerance (Gi) Diarrhea and Rash   Egg-Derived Products Nausea And Vomiting   Molds & Smuts Other (See Comments)    Triggers asthma   Penicillins Other (See Comments)    Family history of this- Almost killed the patient's mother and grandmother    Medications Prior to Admission  Medication Sig Dispense Refill Last Dose   acetaminophen (TYLENOL) 325 MG tablet Take 325-650 mg by mouth every 6 (six) hours as needed for mild pain, headache or fever.      albuterol (VENTOLIN HFA) 108 (90 Base) MCG/ACT inhaler Inhale 1-2 puffs into the lungs every 6 (six) hours as needed for wheezing or shortness of breath. 1 each 0    albuterol (VENTOLIN HFA) 108 (90 Base) MCG/ACT inhaler Inhale 1-2 puffs into the lungs every 6 (six) hours as needed for wheezing or shortness of breath. 1 each 0    budesonide (PULMICORT) 0.25 MG/2ML nebulizer solution Take 2 mLs (0.25 mg total) by nebulization daily. 60 mL 12    cetirizine (ZYRTEC) 10 MG tablet Take  1 tablet (10 mg total) by mouth daily. (Patient taking differently: Take 10 mg by mouth daily as needed for allergies or rhinitis.) 30 tablet 0    dicyclomine (BENTYL) 20 MG tablet Take 1 tablet (20 mg total) by mouth 2 (two) times daily. 20 tablet 0    docusate sodium (COLACE) 100 MG capsule Take 1 capsule (100 mg total) by mouth 2 (two) times daily as needed for mild constipation or moderate constipation. 10 capsule 0    fluticasone (FLONASE) 50 MCG/ACT nasal spray Place 1 spray into both nostrils daily. 16 g 0    guaiFENesin 200 MG tablet Take 1 tablet (200 mg total) by mouth every 4 (four) hours as needed for cough or to loosen phlegm. 30 tablet 0    ibuprofen (ADVIL) 600 MG tablet Take 1 tablet (600 mg total) by mouth every 6 (six) hours as needed. 30 tablet 0    Investigational - Study Medication Inhale 2 puffs into the lungs See admin instructions. Hailie study inhalers  "AV007- Batura" [phone] or [phone]) Inhale 2 puffs into the lungs every 4 hours as needed for wheezing or shortness of breath Additional study details: Study started in early 06/2022 and will last through approx 08/2022. Med ID [id number]; Batch [phone] (Patient not taking: Reported on 07/22/2022)      loratadine (CLARITIN) 10 MG tablet Take 1 tablet (10 mg total) by mouth daily. 30 tablet 1    phenol (CHLORASEPTIC) 1.4 % LIQD Use as directed 1 spray in the mouth or throat as needed for throat irritation / pain.  0    phenylephrine ([employer] NASAL SPRAY) 1 % nasal spray PLACE 2 SPRAYS INTO BOTH NOSTRILS AS NEEDED FOR UP TO 7 DAYS FOR CONGESTION. 1 mL 0     Review of Systems  Constitutional:  Positive for appetite change. Negative for fever.  Eyes:  Negative for visual disturbance.  Cardiovascular:  Negative for chest pain.  Gastrointestinal:  Positive for abdominal pain, nausea and vomiting.  Genitourinary:  Negative for dysuria and vaginal bleeding.  Musculoskeletal:  Positive for arthralgias and myalgias.  Neurological:  Positive for headaches.   Physical Exam   Blood pressure 136/73, pulse 93, temperature 99.2 F (37.3 C), temperature source Oral, resp. rate 17, height  (1.651 m), weight 103 kg, last menstrual period 10/13/2022, SpO2 99 %. FHR: 155 Physical Exam Vitals reviewed.  Constitutional:      General: She is not in acute distress.    Appearance: She is not ill-appearing, toxic-appearing or diaphoretic.  HENT:     Head: Normocephalic.     Mouth/Throat:     Mouth: Mucous membranes are moist.  Eyes:     Extraocular Movements: Extraocular movements intact.  Cardiovascular:     Rate and Rhythm: Normal rate and regular rhythm.  Pulmonary:     Effort: Pulmonary effort is normal.     Breath sounds: Normal breath sounds.  Abdominal:     Tenderness: There is generalized abdominal tenderness. There is guarding. There is no rebound.  Neurological:     Mental Status: She  is alert and oriented to person, place, and time.  Psychiatric:        Mood and Affect: Mood normal.        Behavior: Behavior normal.    MAU Course  Procedures  MDM  S/p protonix, reglan, IVF bolus. She is feeling better. Will plan for PO challenge.   Pt informed that the ultrasound is considered a limited OB ultrasound and is  not intended to be a complete ultrasound exam.  Patient also informed that the ultrasound is not being completed with the intent of assessing for fetal or placental anomalies or any pelvic abnormalities.  Explained that the purpose of today's ultrasound is to assess for   assess fetal movement .  Patient acknowledges the purpose of the exam and the limitations of the study.   [place]: +Cardiac activity, +FM   Elevated BP reading- baseline labs obtained and to assess white count and electrolytes given several episodes of emesis.   Assessment and Plan    Randa Evens Autry-Lott 03/10/2023, 7:32 PM    Report given to Wynelle Bourgeois.    Vitals:   03/10/23 1851  BP: 136/73  Pulse: 93  Resp: 17  Temp: 99.2 F (37.3 C)  TempSrc: Oral  SpO2: 99%  Weight: 103 kg  Height: 5\' 5"  (1.651 m)   Results for orders placed or performed during the hospital encounter of 03/10/23 (from the past 24 hour(s))  Urinalysis, Routine w reflex microscopic -Urine, Clean Catch     Status: Abnormal   Collection Time: 03/10/23  8:34 PM  Result Value Ref Range   Color, Urine YELLOW YELLOW   APPearance HAZY (A) CLEAR   Specific Gravity, Urine 1.020 1.005 - 1.030   pH 7.0 5.0 - 8.0   Glucose, UA NEGATIVE NEGATIVE mg/dL   Hgb urine dipstick NEGATIVE NEGATIVE   Bilirubin Urine NEGATIVE NEGATIVE   Ketones, ur NEGATIVE NEGATIVE mg/dL   Protein, ur NEGATIVE NEGATIVE mg/dL   Nitrite NEGATIVE NEGATIVE   Leukocytes,Ua NEGATIVE NEGATIVE  CBC     Status: Abnormal   Collection Time: 03/10/23  8:34 PM  Result Value Ref Range   WBC 15.2 (H) 4.0 - 10.5 K/uL   RBC 3.93 3.87 - 5.11 MIL/uL    Hemoglobin 10.7 (L) 12.0 - 15.0 g/dL   HCT 16.1 (L) 09.6 - 04.5 %   MCV 83.0 80.0 - 100.0 fL   MCH 27.2 26.0 - 34.0 pg   MCHC 32.8 30.0 - 36.0 g/dL   RDW 40.9 81.1 - 91.4 %   Platelets 351 150 - 400 K/uL   nRBC 0.0 0.0 - 0.2 %  Comprehensive metabolic panel     Status: Abnormal   Collection Time: 03/10/23  8:34 PM  Result Value Ref Range   Sodium 137 135 - 145 mmol/L   Potassium 3.5 3.5 - 5.1 mmol/L   Chloride 103 98 - 111 mmol/L   CO2 24 22 - 32 mmol/L   Glucose, Bld 78 70 - 99 mg/dL   BUN 5 (L) 6 - 20 mg/dL   Creatinine, Ser 7.82 0.44 - 1.00 mg/dL   Calcium 9.2 8.9 - 95.6 mg/dL   Total Protein 6.9 6.5 - 8.1 g/dL   Albumin 3.2 (L) 3.5 - 5.0 g/dL   AST 26 15 - 41 U/L   ALT 13 0 - 44 U/L   Alkaline Phosphatase 94 38 - 126 U/L   Total Bilirubin 0.4 0.3 - 1.2 mg/dL   GFR, Estimated >21 >30 mL/min   Anion gap 10 5 - 15  Protein / creatinine ratio, urine     Status: None   Collection Time: 03/10/23  8:34 PM  Result Value Ref Range   Creatinine, Urine 201 mg/dL   Total Protein, Urine 12 mg/dL   Protein Creatinine Ratio 0.06 0.00 - 0.15 mg/mg[Cre]   Felt better after above treatment Ready to go home  A:  Single IUP at [redacted]w[redacted]d  Nausea and vomiting       Mild dehydration       Cramping  P;  DIscharge home       Rx Reglan and Zofran for nausea prn         Advance diet as tolerated        List of OB providers given       Encouraged to return if she develops worsening of symptoms, increase in pain, fever, or other concerning symptoms.   Aviva Signs, CNM

## 2023-03-10 NOTE — Discharge Instructions (Signed)
[facility] Providers   [facility]             [address], [place], [place] [address] [phone]  [facility]                                                             [address], [address], [place], [place], [address] [phone]  [facility]                                    [address], [address], [place], [place], [address] [phone]  [facility] [place] [address], [address], [place], [place], [address] [phone]  [facility]                                 [address], [place], [place], [address] [phone]  [facility]                                    [address], [place], [place], [address] [phone]  [facility] [address], [address], [place], [place], [address]                              [facility] [address], [address], [place], [place], [address] [phone]  [facility]         Phone: [phone]  [facility]      Phone: [phone]   [facility]      Phone: [phone]  [facility]         Phone: [phone]   [facility]    Phone: [phone]  [facility]      Phone: [phone]  [facility]     Phone: [phone]  [facility]        Phone: [phone]  [facility] (Sheronette Cousins) [phone]  [facility]      Phone: [phone]   

## 2023-03-10 NOTE — MAU Note (Signed)
.  Janice Nicholson is a 22 y.o. at [redacted]w[redacted]d here in MAU reporting: for last 3 days she has not been able to keep anything down. Also reports lower abd cramping for the last 3 days wakes her up from sleep.denies bleeding.  Onset of complaint: 3 days Pain score: 8/10 Vitals:   03/10/23 1851  BP: 136/73  Pulse: 93  Resp: 17  Temp: 99.2 F (37.3 C)  SpO2: 99%     FHT:155 Lab orders placed from triage:urine

## 2023-03-12 ENCOUNTER — Other Ambulatory Visit: Payer: Self-pay | Admitting: Obstetrics & Gynecology

## 2023-03-12 DIAGNOSIS — O99211 Obesity complicating pregnancy, first trimester: Secondary | ICD-10-CM

## 2023-03-12 DIAGNOSIS — Z363 Encounter for antenatal screening for malformations: Secondary | ICD-10-CM

## 2023-03-16 ENCOUNTER — Ambulatory Visit: Payer: Medicaid Other | Admitting: *Deleted

## 2023-03-16 ENCOUNTER — Ambulatory Visit: Payer: Medicaid Other | Attending: Family Medicine

## 2023-03-16 ENCOUNTER — Other Ambulatory Visit: Payer: Self-pay | Admitting: *Deleted

## 2023-03-16 ENCOUNTER — Encounter: Payer: Self-pay | Admitting: *Deleted

## 2023-03-16 VITALS — BP 100/59 | HR 82

## 2023-03-16 DIAGNOSIS — F1290 Cannabis use, unspecified, uncomplicated: Secondary | ICD-10-CM | POA: Insufficient documentation

## 2023-03-16 DIAGNOSIS — O99320 Drug use complicating pregnancy, unspecified trimester: Secondary | ICD-10-CM | POA: Insufficient documentation

## 2023-03-16 DIAGNOSIS — O99322 Drug use complicating pregnancy, second trimester: Secondary | ICD-10-CM

## 2023-03-16 DIAGNOSIS — Z8279 Family history of other congenital malformations, deformations and chromosomal abnormalities: Secondary | ICD-10-CM

## 2023-03-16 DIAGNOSIS — O99212 Obesity complicating pregnancy, second trimester: Secondary | ICD-10-CM

## 2023-03-16 DIAGNOSIS — O99211 Obesity complicating pregnancy, first trimester: Secondary | ICD-10-CM | POA: Diagnosis present

## 2023-03-16 DIAGNOSIS — E669 Obesity, unspecified: Secondary | ICD-10-CM

## 2023-03-16 DIAGNOSIS — O09892 Supervision of other high risk pregnancies, second trimester: Secondary | ICD-10-CM | POA: Diagnosis present

## 2023-03-16 DIAGNOSIS — Z362 Encounter for other antenatal screening follow-up: Secondary | ICD-10-CM

## 2023-03-16 DIAGNOSIS — Z363 Encounter for antenatal screening for malformations: Secondary | ICD-10-CM | POA: Diagnosis present

## 2023-03-16 DIAGNOSIS — Z3A22 22 weeks gestation of pregnancy: Secondary | ICD-10-CM

## 2023-04-06 ENCOUNTER — Inpatient Hospital Stay (HOSPITAL_COMMUNITY)
Admission: AD | Admit: 2023-04-06 | Discharge: 2023-04-06 | Disposition: A | Discharge status: Home / Self Care | Payer: Medicaid Other | Attending: Family Medicine | Admitting: Family Medicine

## 2023-04-06 DIAGNOSIS — O26892 Other specified pregnancy related conditions, second trimester: Secondary | ICD-10-CM | POA: Diagnosis not present

## 2023-04-06 DIAGNOSIS — J029 Acute pharyngitis, unspecified: Secondary | ICD-10-CM | POA: Insufficient documentation

## 2023-04-06 DIAGNOSIS — R1310 Dysphagia, unspecified: Secondary | ICD-10-CM | POA: Diagnosis not present

## 2023-04-06 DIAGNOSIS — Z3A25 25 weeks gestation of pregnancy: Secondary | ICD-10-CM | POA: Insufficient documentation

## 2023-04-06 MED ORDER — GUAIFENESIN-CODEINE 100-10 MG/5ML PO SOLN: 5.0000 mL | Freq: Three times a day (TID) | ORAL | 0 refills | Status: DC | PRN

## 2023-04-06 MED ORDER — ALBUTEROL SULFATE HFA 108 (90 BASE) MCG/ACT IN AERS: 1.0000 | INHALATION_SPRAY | Freq: Four times a day (QID) | RESPIRATORY_TRACT | 0 refills | Status: DC | PRN

## 2023-04-06 NOTE — MAU Note (Signed)
Janice Nicholson is a 22 y.o. at [redacted]w[redacted]d here in MAU reporting: yesterday, felt her throat starting to itch up and swell. After last night, wasn't as bad, but is having a hard time swallowing, unable to keep down fluids again (threw up at 0500).  Ankles are starting to swell. Body is starting to ache a lot. Reports a fever last night , it was "one something". Slight cough. No bleeding or LOF, reports +FM  Onset of complaint: last night Pain score: 9 Vitals:   04/06/23 1438 04/06/23 1441  BP:  120/68  Pulse:  98  Resp:  16  Temp:  98.2 F (36.8 C)  SpO2: 98% 99%     FHT:150 Lab orders placed from triage:

## 2023-04-06 NOTE — MAU Provider Note (Signed)
Event Date/Time   First Provider Initiated Contact with Patient 04/06/23 1449      S [name] is a 22 y.o. G1P0 patient who presents to MAU today with complaint of sore throat with odynophagia since yesterday.  She is finding it difficult to swallow because of the pain.  She reports decreased oral food and liquid intake.  She does have antinausea medications.  No sick contacts.  She does have a history of tonsillectomy.  Denies fevers  O BP 120/68 (BP Location: Right Arm)   Pulse 98   Temp 98.2 F (36.8 C) (Oral)   Resp 16   Ht 5\' 5"  (1.651 m)   Wt 100.2 kg   LMP 10/13/2022   SpO2 99%   BMI 36.76 kg/m  Physical Exam Vitals and nursing note reviewed.  Constitutional:      Appearance: She is well-developed.  HENT:     Head: Normocephalic and atraumatic.     Mouth/Throat:     Tonsils: No tonsillar exudate.  Cardiovascular:     Rate and Rhythm: Normal rate and regular rhythm.     Heart sounds: No murmur heard.    No gallop.  Pulmonary:     Effort: Pulmonary effort is normal. No respiratory distress.     Breath sounds: No wheezing or rales.  Skin:    General: Skin is warm and dry.     Capillary Refill: Capillary refill takes less than 2 seconds.  Neurological:     General: No focal deficit present.     Mental Status: She is alert and oriented to person, place, and time.  Psychiatric:        Mood and Affect: Mood normal.        Behavior: Behavior normal.     A 1. [redacted] weeks gestation of pregnancy   2. Viral pharyngitis      P Discharge from MAU in stable condition Symptomatic control with fluids, throat lozenges, chlorseptic spray, etc.  Guaifenesis with codeine prescribed. F/u as needed. Warning signs for worsening condition that would warrant emergency follow-up discussed Patient may return to MAU as needed   Levie Heritage, DO 04/06/2023 2:57 PM

## 2023-04-13 ENCOUNTER — Ambulatory Visit: Payer: Medicaid Other

## 2023-04-13 ENCOUNTER — Ambulatory Visit: Payer: Medicaid Other | Attending: Maternal & Fetal Medicine

## 2023-04-13 VITALS — BP 129/62 | HR 84

## 2023-04-13 DIAGNOSIS — F1290 Cannabis use, unspecified, uncomplicated: Secondary | ICD-10-CM | POA: Insufficient documentation

## 2023-04-13 DIAGNOSIS — E669 Obesity, unspecified: Secondary | ICD-10-CM | POA: Diagnosis not present

## 2023-04-13 DIAGNOSIS — Z362 Encounter for other antenatal screening follow-up: Secondary | ICD-10-CM | POA: Diagnosis present

## 2023-04-13 DIAGNOSIS — Z8279 Family history of other congenital malformations, deformations and chromosomal abnormalities: Secondary | ICD-10-CM

## 2023-04-13 DIAGNOSIS — O99320 Drug use complicating pregnancy, unspecified trimester: Secondary | ICD-10-CM | POA: Diagnosis present

## 2023-04-13 DIAGNOSIS — O09892 Supervision of other high risk pregnancies, second trimester: Secondary | ICD-10-CM | POA: Insufficient documentation

## 2023-04-13 DIAGNOSIS — O99212 Obesity complicating pregnancy, second trimester: Secondary | ICD-10-CM | POA: Insufficient documentation

## 2023-04-13 DIAGNOSIS — Z3A26 26 weeks gestation of pregnancy: Secondary | ICD-10-CM

## 2023-05-07 ENCOUNTER — Encounter: Payer: Self-pay | Admitting: *Deleted

## 2023-05-11 ENCOUNTER — Ambulatory Visit: Payer: Medicaid Other | Attending: Maternal & Fetal Medicine

## 2023-05-11 ENCOUNTER — Ambulatory Visit: Payer: Medicaid Other | Admitting: *Deleted

## 2023-05-11 ENCOUNTER — Encounter: Payer: Self-pay | Admitting: *Deleted

## 2023-05-11 DIAGNOSIS — Z362 Encounter for other antenatal screening follow-up: Secondary | ICD-10-CM | POA: Insufficient documentation

## 2023-05-11 DIAGNOSIS — O99212 Obesity complicating pregnancy, second trimester: Secondary | ICD-10-CM | POA: Diagnosis not present

## 2023-05-11 DIAGNOSIS — O99320 Drug use complicating pregnancy, unspecified trimester: Secondary | ICD-10-CM | POA: Diagnosis not present

## 2023-05-11 DIAGNOSIS — E669 Obesity, unspecified: Secondary | ICD-10-CM

## 2023-05-11 DIAGNOSIS — Z8279 Family history of other congenital malformations, deformations and chromosomal abnormalities: Secondary | ICD-10-CM | POA: Diagnosis present

## 2023-05-11 DIAGNOSIS — F1290 Cannabis use, unspecified, uncomplicated: Secondary | ICD-10-CM | POA: Diagnosis not present

## 2023-05-11 DIAGNOSIS — Z3A30 30 weeks gestation of pregnancy: Secondary | ICD-10-CM

## 2023-05-11 DIAGNOSIS — O352XX0 Maternal care for (suspected) hereditary disease in fetus, not applicable or unspecified: Secondary | ICD-10-CM | POA: Diagnosis not present

## 2023-05-12 ENCOUNTER — Other Ambulatory Visit: Payer: Self-pay | Admitting: *Deleted

## 2023-05-12 DIAGNOSIS — O99213 Obesity complicating pregnancy, third trimester: Secondary | ICD-10-CM

## 2023-05-19 ENCOUNTER — Ambulatory Visit (INDEPENDENT_AMBULATORY_CARE_PROVIDER_SITE_OTHER): Payer: Medicaid Other | Admitting: Family Medicine

## 2023-05-19 ENCOUNTER — Other Ambulatory Visit: Payer: Self-pay

## 2023-05-19 ENCOUNTER — Encounter: Payer: Self-pay | Admitting: Family Medicine

## 2023-05-19 VITALS — BP 122/71 | HR 105 | Wt 232.0 lb

## 2023-05-19 DIAGNOSIS — Z8279 Family history of other congenital malformations, deformations and chromosomal abnormalities: Secondary | ICD-10-CM

## 2023-05-19 DIAGNOSIS — Z3A31 31 weeks gestation of pregnancy: Secondary | ICD-10-CM

## 2023-05-19 DIAGNOSIS — O99013 Anemia complicating pregnancy, third trimester: Secondary | ICD-10-CM

## 2023-05-19 DIAGNOSIS — O0990 Supervision of high risk pregnancy, unspecified, unspecified trimester: Secondary | ICD-10-CM

## 2023-05-19 DIAGNOSIS — O99210 Obesity complicating pregnancy, unspecified trimester: Secondary | ICD-10-CM

## 2023-05-19 NOTE — Progress Notes (Unsigned)
   PRENATAL VISIT NOTE  Subjective:  Janice Nicholson is a 22 y.o. G1P0 at [redacted]w[redacted]d being seen today for ongoing prenatal care. She is transferring her care from [facility] to [facility].  She is currently monitored for the following issues for this high-risk pregnancy and has Elevated BMI (pBMI 37); Obesity affecting pregnancy; High risk teen pregnancy; Family history of Down syndrome; Marijuana use during pregnancy; and Supervision of high risk pregnancy, antepartum on their problem list.  Patient reports no complaints.  Contractions: Not present. Vag. Bleeding: None.  Movement: Present. Denies leaking of fluid.   The following portions of the patient's history were reviewed and updated as appropriate: allergies, current medications, past family history, past medical history, past social history, past surgical history and problem list.   Objective:   Vitals:   05/19/23 1448  BP: 122/71  Pulse: (!) 105  Weight: 232 lb (105.2 kg)    Fetal Status: Fetal Heart Rate (bpm): 130   Movement: Present     General:  Alert, oriented and cooperative. Patient is in no acute distress.  Skin: Skin is warm and dry. No rash noted.   Cardiovascular: Normal heart rate noted  Respiratory: Normal respiratory effort, no problems with respiration noted  Abdomen: Soft, gravid, appropriate for gestational age.  Pain/Pressure: Absent     Pelvic: Cervical exam deferred        Extremities: Normal range of motion.     Mental Status: Normal mood and affect. Normal behavior. Normal judgment and thought content.   Assessment and Plan:  Pregnancy: G1P0 at [redacted]w[redacted]d 1. Supervision of high risk pregnancy, antepartum Reviewed HD records.  2. Obesity affecting pregnancy, antepartum, unspecified obesity type [place] f/u 7/29. EFW 43% @30 /0.   3. Family history of Down syndrome The patient sister. Negative panorama  4. [redacted] weeks gestation of pregnancy Normal GTT, 2nd trimester labs, and received Tdap at HD   Preterm labor symptoms and  general obstetric precautions including but not limited to vaginal bleeding, contractions, leaking of fluid and fetal movement were reviewed in detail with the patient. Please refer to After Visit Summary for other counseling recommendations.   No follow-ups on file.  Future Appointments  Date Time Provider Department Center  06/22/2023  2:30 PM [facility] NURSE [facility]  06/22/2023  2:45 PM [facility]    Harvie Morua Autry-Lott, DO

## 2023-05-19 NOTE — Progress Notes (Signed)
Patient informed me that she has received prenatal care at the [facility] before switching to [facility]. She stated that she was prescribed "baby Asprin" and iron pills there. She also completed her glucose test and gotten the Tdap vaccine.   Last prenatal appointment at the HD was "yesterday" (05/18/23). Ms. Neuberger stated that she had physical documents of her prenatal care but "left it at home"    Will take Pt to [facility] once visit has been completed

## 2023-05-20 DIAGNOSIS — O99019 Anemia complicating pregnancy, unspecified trimester: Secondary | ICD-10-CM | POA: Insufficient documentation

## 2023-05-26 ENCOUNTER — Other Ambulatory Visit: Payer: Self-pay

## 2023-05-26 ENCOUNTER — Encounter (HOSPITAL_COMMUNITY): Payer: Self-pay | Admitting: Obstetrics and Gynecology

## 2023-05-26 ENCOUNTER — Inpatient Hospital Stay (HOSPITAL_COMMUNITY)
Admission: AD | Admit: 2023-05-26 | Discharge: 2023-05-26 | Disposition: A | Discharge status: Home / Self Care | Payer: Medicaid Other | Attending: Obstetrics and Gynecology | Admitting: Obstetrics and Gynecology

## 2023-05-26 DIAGNOSIS — O26893 Other specified pregnancy related conditions, third trimester: Secondary | ICD-10-CM | POA: Insufficient documentation

## 2023-05-26 DIAGNOSIS — Z3A32 32 weeks gestation of pregnancy: Secondary | ICD-10-CM

## 2023-05-26 DIAGNOSIS — R110 Nausea: Secondary | ICD-10-CM | POA: Diagnosis not present

## 2023-05-26 DIAGNOSIS — O368130 Decreased fetal movements, third trimester, not applicable or unspecified: Secondary | ICD-10-CM

## 2023-05-26 DIAGNOSIS — R109 Unspecified abdominal pain: Secondary | ICD-10-CM | POA: Insufficient documentation

## 2023-05-26 DIAGNOSIS — O99213 Obesity complicating pregnancy, third trimester: Secondary | ICD-10-CM | POA: Insufficient documentation

## 2023-05-26 DIAGNOSIS — O26899 Other specified pregnancy related conditions, unspecified trimester: Secondary | ICD-10-CM

## 2023-05-26 HISTORY — DX: Nausea: R11.0

## 2023-05-26 LAB — URINALYSIS, ROUTINE W REFLEX MICROSCOPIC
Bilirubin Urine: NEGATIVE
Glucose, UA: NEGATIVE mg/dL
Hgb urine dipstick: NEGATIVE
Ketones, ur: NEGATIVE mg/dL
Leukocytes,Ua: NEGATIVE
Nitrite: NEGATIVE
Protein, ur: NEGATIVE mg/dL
Specific Gravity, Urine: 1.01 (ref 1.005–1.030)
pH: 7 (ref 5.0–8.0)

## 2023-05-26 MED ORDER — ONDANSETRON HCL 4 MG PO TABS: 4.0000 mg | ORAL_TABLET | Freq: Three times a day (TID) | ORAL | 0 refills | Status: DC | PRN

## 2023-05-26 MED ORDER — ACETAMINOPHEN 500 MG PO TABS
1000.0000 mg | ORAL_TABLET | Freq: Once | ORAL | Status: AC
  Administered 2023-05-26: 1000 mg via ORAL
  Filled 2023-05-26: qty 2

## 2023-05-26 MED ORDER — LACTATED RINGERS IV BOLUS
1000.0000 mL | Freq: Once | INTRAVENOUS | Status: AC
  Administered 2023-05-26: 1000 mL via INTRAVENOUS

## 2023-05-26 MED ORDER — ONDANSETRON HCL 4 MG/2ML IJ SOLN
4.0000 mg | Freq: Once | INTRAMUSCULAR | Status: AC
  Administered 2023-05-26: 4 mg via INTRAVENOUS
  Filled 2023-05-26: qty 2

## 2023-05-26 NOTE — MAU Provider Note (Signed)
MAU Provider Note  History  [id number]  Arrival date and time: 05/26/23 1709   Chief Complaint  Patient presents with   Contractions     HPI Janice Nicholson is a 22 y.o. G1P0 at [redacted]w[redacted]d by LMP with PMHx notable for obesity, teen pregnancy, marijuana use, anemia, who presents for abdominal pain/Braxton Hicks, nausea and vomiting, headaches.  Some decreased fetal movement.   Vaginal bleeding: No LOF: No Fetal Movement: Yes Contractions: No     OB History  Gravida Para Term Preterm AB Living  1            SAB IAB Ectopic Multiple Live Births               # Outcome Date GA Lbr Len/2nd Weight Sex Delivery Anes PTL Lv  1 Current              Past Medical History:  Diagnosis Date   Acute biliary pancreatitis 07/24/2022   ADHD (attention deficit hyperactivity disorder), combined type 03/10/2016   Adjustment disorder with depressed mood 03/10/2016   Asthma    Attention deficit hyperactivity disorder (ADHD)    Environmental allergies    Gallstone pancreatitis 07/22/2022   Generalized anxiety disorder 03/10/2016   Lactose intolerance    Moderate persistent asthma without complication 08/22/2019   Last Assessment & Plan:   Resume maintenance therapy, this time with Advair Diskus, as Flovent was ineffective historically.   Primary insomnia 08/22/2019   Last Assessment & Plan:   Managed by Renee Pain at [facility]   UTI (urinary tract infection)     Past Surgical History:  Procedure Laterality Date   ADENOIDECTOMY     CHOLECYSTECTOMY N/A 07/23/2022   Procedure: LAPAROSCOPIC CHOLECYSTECTOMY WITH INTRAOPERATIVE CHOLANGIOGRAM;  Surgeon: Griselda Miner, MD;  Location: [facility];  Service: General;  Laterality: N/A;   TONSILLECTOMY AND ADENOIDECTOMY      Family History  Problem Relation Age of Onset   Asthma Mother    Hypertension Mother    Diabetes Father    Diabetes Maternal Grandfather    Stroke Maternal Grandfather    Hypertension Maternal Grandfather     Heart disease Maternal Grandfather     Social History   Socioeconomic History   Marital status: Single    Spouse name: Not on file   Number of children: Not on file   Years of education: Not on file   Highest education level: Not on file  Occupational History   Not on file  Tobacco Use   Smoking status: Never    Passive exposure: Yes   Smokeless tobacco: Never  Vaping Use   Vaping Use: Former  Substance and Sexual Activity   Alcohol use: Not Currently   Drug use: Not Currently    Types: Marijuana    Comment: + 01/08/23   Sexual activity: Not on file  Other Topics Concern   Not on file  Social History Narrative   Not on file   Social Determinants of Health   Financial Resource Strain: Not on file  Food Insecurity: Food Insecurity Present (05/19/2023)   Hunger Vital Sign    Worried About Running Out of Food in the Last Year: Often true    Ran Out of Food in the Last Year: Often true  Transportation Needs: No Transportation Needs (05/19/2023)   PRAPARE - Administrator, Civil Service (Medical): No    Lack of Transportation (Non-Medical): No  Physical Activity: Not on file  Stress: Not  on file  Social Connections: Not on file  Intimate Partner Violence: Not on file    Allergies  Allergen Reactions   Lactose Intolerance (Gi) Diarrhea and Rash   Egg-Derived Products Nausea And Vomiting   Molds & Smuts Other (See Comments)    Triggers asthma   Penicillins Other (See Comments)    Family history of this- Almost killed the patient's mother and grandmother    No current facility-administered medications on file prior to encounter.   Current Outpatient Medications on File Prior to Encounter  Medication Sig Dispense Refill   acetaminophen (TYLENOL) 325 MG tablet Take 325-650 mg by mouth every 6 (six) hours as needed for mild pain, headache or fever. (Patient not taking: Reported on 05/19/2023)     albuterol (VENTOLIN HFA) 108 (90 Base) MCG/ACT inhaler Inhale  1-2 puffs into the lungs every 6 (six) hours as needed for wheezing or shortness of breath. 1 each 0   albuterol (VENTOLIN HFA) 108 (90 Base) MCG/ACT inhaler Inhale 1-2 puffs into the lungs every 6 (six) hours as needed for wheezing or shortness of breath. 1 each 0   budesonide (PULMICORT) 0.25 MG/2ML nebulizer solution Take 2 mLs (0.25 mg total) by nebulization daily. (Patient not taking: Reported on 05/19/2023) 60 mL 12   cetirizine (ZYRTEC) 10 MG tablet Take 1 tablet (10 mg total) by mouth daily. (Patient taking differently: Take 10 mg by mouth daily as needed for allergies or rhinitis.) 30 tablet 0   fluticasone (FLONASE) 50 MCG/ACT nasal spray Place 1 spray into both nostrils daily. (Patient not taking: Reported on 04/13/2023) 16 g 0   guaiFENesin 200 MG tablet Take 1 tablet (200 mg total) by mouth every 4 (four) hours as needed for cough or to loosen phlegm. (Patient not taking: Reported on 03/16/2023) 30 tablet 0   guaiFENesin-codeine 100-10 MG/5ML syrup Take 5 mLs by mouth 3 (three) times daily as needed for cough. (Patient not taking: Reported on 04/13/2023) 120 mL 0   Investigational - Study Medication Inhale 2 puffs into the lungs See admin instructions. Hailie study inhalers "AV007- Batura" [phone] or [phone]) Inhale 2 puffs into the lungs every 4 hours as needed for wheezing or shortness of breath Additional study details: Study started in early 06/2022 and will last through approx 08/2022. Med ID [id number]; Batch [phone] (Patient not taking: Reported on 07/22/2022)     loratadine (CLARITIN) 10 MG tablet Take 1 tablet (10 mg total) by mouth daily. (Patient not taking: Reported on 04/13/2023) 30 tablet 1   metoCLOPramide (REGLAN) 10 MG tablet Take 1 tablet (10 mg total) by mouth every 6 (six) hours as needed for nausea. (Patient not taking: Reported on 05/19/2023) 30 tablet 0   ondansetron (ZOFRAN-ODT) 4 MG disintegrating tablet Take 1 tablet (4 mg total) by mouth every 6 (six) hours as  needed for nausea. (Patient not taking: Reported on 04/13/2023) 20 tablet 0   phenol (CHLORASEPTIC) 1.4 % LIQD Use as directed 1 spray in the mouth or throat as needed for throat irritation / pain. (Patient not taking: Reported on 05/19/2023)  0   phenylephrine ([employer] NASAL SPRAY) 1 % nasal spray PLACE 2 SPRAYS INTO BOTH NOSTRILS AS NEEDED FOR UP TO 7 DAYS FOR CONGESTION. (Patient not taking: Reported on 05/19/2023) 1 mL 0   Prenatal Vit-Fe Fumarate-FA (PRENATAL VITAMIN PO) Take by mouth.      ROS: Pertinent positives and negative per HPI, all others reviewed and negative  Physical Exam   BP 127/64  Pulse 91   Temp 98.3 F (36.8 C)   Resp 12   LMP 10/13/2022   SpO2 98%   Patient Vitals for the past 24 hrs:  BP Temp Pulse Resp SpO2  05/26/23 1737 -- 98.3 F (36.8 C) -- 12 98 %  05/26/23 1736 127/64 -- 91 -- 99 %    Physical Exam Vitals reviewed.  Constitutional:      Appearance: Normal appearance.  HENT:     Head: Normocephalic and atraumatic.     Right Ear: External ear normal.     Left Ear: External ear normal.     Nose: Nose normal.     Mouth/Throat:     Pharynx: Oropharynx is clear.  Eyes:     Conjunctiva/sclera: Conjunctivae normal.  Cardiovascular:     Rate and Rhythm: Normal rate and regular rhythm.  Pulmonary:     Effort: Pulmonary effort is normal.     Breath sounds: Normal breath sounds.  Abdominal:     General: Abdomen is flat.     Palpations: Abdomen is soft.     Comments: Gravid  Genitourinary:    General: Normal vulva.     Vagina: No vaginal discharge.     Rectum: Normal.     Comments: SVE not done Skin:    General: Skin is warm.     Capillary Refill: Capillary refill takes less than 2 seconds.  Neurological:     Mental Status: Mental status is at baseline.  Psychiatric:        Mood and Affect: Mood normal.     Bedside Ultrasound Done Pt informed that the ultrasound is considered a limited OB ultrasound and is not intended to be a complete  ultrasound exam.  Patient also informed that the ultrasound is not being completed with the intent of assessing for fetal or placental anomalies or any pelvic abnormalities.  Explained that the purpose of today's ultrasound is to assess for  viability.  Patient acknowledges the purpose of the exam and the limitations of the study.     FHT 140s, noted limb movement  FHT 145 bpm/Moderate variability/ 15x15 accels/ None decels CAT: 1 Toco: none   Labs Results for orders placed or performed during the hospital encounter of 05/26/23 (from the past 24 hour(s))  Urinalysis, Routine w reflex microscopic -Urine, Clean Catch     Status: Abnormal   Collection Time: 05/26/23  5:23 PM  Result Value Ref Range   Color, Urine YELLOW YELLOW   APPearance HAZY (A) CLEAR   Specific Gravity, Urine 1.010 1.005 - 1.030   pH 7.0 5.0 - 8.0   Glucose, UA NEGATIVE NEGATIVE mg/dL   Hgb urine dipstick NEGATIVE NEGATIVE   Bilirubin Urine NEGATIVE NEGATIVE   Ketones, ur NEGATIVE NEGATIVE mg/dL   Protein, ur NEGATIVE NEGATIVE mg/dL   Nitrite NEGATIVE NEGATIVE   Leukocytes,Ua NEGATIVE NEGATIVE    Imaging No results found.  MAU Course  MDM: moderate  This patient presents to the ED for concern of   Chief Complaint  Patient presents with   Contractions     These complains involves an extensive number of treatment options, and is a complaint that carries with it a high risk of complications and morbidity.  The differential diagnosis for  1.  Nausea/vomiting INCLUDES pregnancy related, infection  2.  Abdominal pain INCLUDES pregnancy related, dehydration  3.  Decreased fetal movement-concern for fetal distress  Co morbidities that complicate the patient evaluation: Obesity  Additional history obtained from Other aunt  Interpreter services used: not applicable  External records from outside source obtained and reviewed including Prenatal care records  Lab Tests: UA  I ordered, and personally  interpreted labs.  The pertinent results include:  UA  Imaging Studies ordered: None  Cardiac Testing/Monitoring:  EKG was not ordered today. I personally reviewed or consulted with a physician to help with intrepretation.    Medicines ordered and prescription drug management:  Medications: Tylenol, Zofran, IV fluids   Reevaluation of the patient after these medicines showed that the patient improved I have reviewed the patients home medicines and have made adjustments as needed  Critical Interventions: IV fluids  Consultations Obtained: None  MAU Course: -  After the interventions noted above, I reevaluated the patient and found that they have :improved  Dispostion: discharged   Assessment and Plan  1. Nausea - Discharge patient  2. Decreased fetal movements in third trimester, single or unspecified fetus  3. Abdominal pain affecting pregnancy  4. [redacted] weeks gestation of pregnancy   Patient seen for issues above.  She was given IV fluids, Tylenol and Zofran which improved her symptoms.  She also noted increased fetal movement after IV fluids.  Bedside ultrasound performed and noted limb movements, fetal body rotations, FHT in the 140s.  Overall reassuring exam after interventions.  Patient felt comfortable going home.  Sent nausea medication to home pharmacy. Gave return and preterm labor precautions.  Follow-up with primary OB.  Discharge Instructions     Discharge patient   Complete by: As directed    Discharge disposition: 01-Home or Self Care   Discharge patient date: 05/26/2023      Allergies as of 05/26/2023       Reactions   Lactose Intolerance (gi) Diarrhea, Rash   Egg-derived Products Nausea And Vomiting   Molds & Smuts Other (See Comments)   Triggers asthma   Penicillins Other (See Comments)   Family history of this- Almost killed the patient's mother and grandmother        Medication List     STOP taking these medications    guaiFENesin 200 MG  tablet   Investigational - Study Medication       TAKE these medications    acetaminophen 325 MG tablet Commonly known as: TYLENOL Take 325-650 mg by mouth every 6 (six) hours as needed for mild pain, headache or fever.   albuterol 108 (90 Base) MCG/ACT inhaler Commonly known as: VENTOLIN HFA Inhale 1-2 puffs into the lungs every 6 (six) hours as needed for wheezing or shortness of breath.   albuterol 108 (90 Base) MCG/ACT inhaler Commonly known as: VENTOLIN HFA Inhale 1-2 puffs into the lungs every 6 (six) hours as needed for wheezing or shortness of breath.   budesonide 0.25 MG/2ML nebulizer solution Commonly known as: Pulmicort Take 2 mLs (0.25 mg total) by nebulization daily.   cetirizine 10 MG tablet Commonly known as: ZYRTEC Take 1 tablet (10 mg total) by mouth daily. What changed:  when to take this reasons to take this   [employer] Nasal Spray 1 % nasal spray Generic drug: phenylephrine PLACE 2 SPRAYS INTO BOTH NOSTRILS AS NEEDED FOR UP TO 7 DAYS FOR CONGESTION.   fluticasone 50 MCG/ACT nasal spray Commonly known as: FLONASE Place 1 spray into both nostrils daily.   guaiFENesin-codeine 100-10 MG/5ML syrup Take 5 mLs by mouth 3 (three) times daily as needed for cough.   loratadine 10 MG tablet Commonly known as: CLARITIN Take 1 tablet (10 mg total) by  mouth daily.   metoCLOPramide 10 MG tablet Commonly known as: REGLAN Take 1 tablet (10 mg total) by mouth every 6 (six) hours as needed for nausea.   ondansetron 4 MG disintegrating tablet Commonly known as: ZOFRAN-ODT Take 1 tablet (4 mg total) by mouth every 6 (six) hours as needed for nausea.   ondansetron 4 MG tablet Commonly known as: Zofran Take 1 tablet (4 mg total) by mouth every 8 (eight) hours as needed for nausea or vomiting.   phenol 1.4 % Liqd Commonly known as: CHLORASEPTIC Use as directed 1 spray in the mouth or throat as needed for throat irritation / pain.   PRENATAL VITAMIN PO Take by  mouth.        Myrtie Hawk, DO FMOB Fellow, Faculty practice [facility], Center for [facility] 05/26/23  7:54 PM

## 2023-05-26 NOTE — MAU Note (Addendum)
.  Janice Nicholson is a 22 y.o. at [redacted]w[redacted]d here in MAU reporting: Pt reports having trouble breathing. Pt reports she was in the same house as someone who had a viral cold. Pt reports she is having braxton hicks. Pt reports having a hot dog and fruit punch last night and reports she threw up this morning what looked like blood, but is unsure if it was blood or just the fruit punch.  Pt reports on going headaches. Pt reports she takes tylenol and states that it helps, but then comes back.  Pt reports decreased fetal movement for 2 days.   Onset of complaint: last night  Pain score: 8/10 There were no vitals filed for this visit.   Lab orders placed from triage:   ua

## 2023-06-09 ENCOUNTER — Other Ambulatory Visit: Payer: Self-pay

## 2023-06-09 ENCOUNTER — Ambulatory Visit (INDEPENDENT_AMBULATORY_CARE_PROVIDER_SITE_OTHER): Payer: Medicaid Other | Admitting: Obstetrics & Gynecology

## 2023-06-09 VITALS — BP 120/72 | HR 100 | Wt 224.0 lb

## 2023-06-09 DIAGNOSIS — O0990 Supervision of high risk pregnancy, unspecified, unspecified trimester: Secondary | ICD-10-CM

## 2023-06-09 DIAGNOSIS — O99013 Anemia complicating pregnancy, third trimester: Secondary | ICD-10-CM

## 2023-06-09 DIAGNOSIS — O0993 Supervision of high risk pregnancy, unspecified, third trimester: Secondary | ICD-10-CM

## 2023-06-09 DIAGNOSIS — Z3A34 34 weeks gestation of pregnancy: Secondary | ICD-10-CM

## 2023-06-09 MED ORDER — PANTOPRAZOLE SODIUM 20 MG PO TBEC
20.0000 mg | DELAYED_RELEASE_TABLET | Freq: Every day | ORAL | 1 refills | Status: DC
Start: 2023-06-09 — End: 2023-07-13

## 2023-06-09 NOTE — Progress Notes (Signed)
   PRENATAL VISIT NOTE  Subjective:  Janice Nicholson is a 22 y.o. G1P0 at [redacted]w[redacted]d being seen today for ongoing prenatal care.  She is currently monitored for the following issues for this high-risk pregnancy and has Elevated BMI (pBMI 37); Obesity affecting pregnancy; High risk teen pregnancy; Family history of Down syndrome; Marijuana use during pregnancy; Supervision of high risk pregnancy, antepartum; Anemia in pregnancy; and Nausea on their problem list.  Patient reports heartburn.  Contractions: Not present.  .  Movement: Present. Denies leaking of fluid.   The following portions of the patient's history were reviewed and updated as appropriate: allergies, current medications, past family history, past medical history, past social history, past surgical history and problem list.   Objective:   Vitals:   06/09/23 1610  BP: 120/72  Pulse: 100  Weight: 224 lb (101.6 kg)    Fetal Status: Fetal Heart Rate (bpm): 143   Movement: Present     General:  Alert, oriented and cooperative. Patient is in no acute distress.  Skin: Skin is warm and dry. No rash noted.   Cardiovascular: Normal heart rate noted  Respiratory: Normal respiratory effort, no problems with respiration noted  Abdomen: Soft, gravid, appropriate for gestational age.  Pain/Pressure: Present     Pelvic: Cervical exam deferred        Extremities: Normal range of motion.     Mental Status: Normal mood and affect. Normal behavior. Normal judgment and thought content.   Assessment and Plan:  Pregnancy: G1P0 at [redacted]w[redacted]d 1. Supervision of high risk pregnancy, antepartum heartburn - pantoprazole (PROTONIX) 20 MG tablet; Take 1 tablet (20 mg total) by mouth daily.  Dispense: 30 tablet; Refill: 1  2. Anemia during pregnancy in third trimester CBC    Component Value Date/Time   WBC 15.2 (H) 03/10/2023 2034   RBC 3.93 03/10/2023 2034   HGB 10.7 (L) 03/10/2023 2034   HGB 12.6 07/21/2022 1553   HCT 32.6 (L) 03/10/2023 2034   HCT  38.6 07/21/2022 1553   PLT 351 03/10/2023 2034   PLT 441 07/21/2022 1553   MCV 83.0 03/10/2023 2034   MCV 80 07/21/2022 1553   MCH 27.2 03/10/2023 2034   MCHC 32.8 03/10/2023 2034   RDW 14.3 03/10/2023 2034   RDW 13.8 07/21/2022 1553   LYMPHSABS 3.8 07/22/2022 1635   LYMPHSABS 3.1 07/21/2022 1553   MONOABS 0.8 07/22/2022 1635   EOSABS 0.1 07/22/2022 1635   EOSABS 0.0 07/21/2022 1553   BASOSABS 0.1 07/22/2022 1635   BASOSABS 0.1 07/21/2022 1553     Preterm labor symptoms and general obstetric precautions including but not limited to vaginal bleeding, contractions, leaking of fluid and fetal movement were reviewed in detail with the patient. Please refer to After Visit Summary for other counseling recommendations.   Return in about 13 days (around 06/22/2023).  Future Appointments  Date Time Provider Department Center  06/22/2023  2:30 PM [facility] NURSE [facility]  06/22/2023  2:45 PM [facility] US5 [facility]  06/22/2023  3:55 PM Warden Fillers, MD [facility]  06/30/2023  3:15 PM Adam Phenix, MD [facility]    Scheryl Darter, MD

## 2023-06-09 NOTE — Progress Notes (Signed)
Patient complains of numbness on upper left abdomen. Patient stated that it started last week at work. Denies any injury to the area. Bodnar explained that she would try to drink water to help with the discomfort. She stated sometimes it works and sometimes it doesn't.

## 2023-06-17 ENCOUNTER — Encounter: Payer: Self-pay | Admitting: *Deleted

## 2023-06-22 ENCOUNTER — Ambulatory Visit (INDEPENDENT_AMBULATORY_CARE_PROVIDER_SITE_OTHER): Payer: Medicaid Other | Admitting: Obstetrics and Gynecology

## 2023-06-22 ENCOUNTER — Other Ambulatory Visit: Payer: Self-pay

## 2023-06-22 ENCOUNTER — Ambulatory Visit: Payer: Medicaid Other | Attending: Obstetrics

## 2023-06-22 ENCOUNTER — Encounter: Payer: Self-pay | Admitting: *Deleted

## 2023-06-22 ENCOUNTER — Ambulatory Visit: Payer: Medicaid Other | Admitting: *Deleted

## 2023-06-22 ENCOUNTER — Other Ambulatory Visit (HOSPITAL_COMMUNITY)
Admission: RE | Admit: 2023-06-22 | Discharge: 2023-06-22 | Disposition: A | Discharge status: Home / Self Care | Payer: Medicaid Other | Source: Ambulatory Visit | Attending: Obstetrics and Gynecology | Admitting: Obstetrics and Gynecology

## 2023-06-22 VITALS — BP 123/79 | HR 85 | Wt 229.0 lb

## 2023-06-22 DIAGNOSIS — E669 Obesity, unspecified: Secondary | ICD-10-CM | POA: Diagnosis not present

## 2023-06-22 DIAGNOSIS — O0990 Supervision of high risk pregnancy, unspecified, unspecified trimester: Secondary | ICD-10-CM

## 2023-06-22 DIAGNOSIS — F1290 Cannabis use, unspecified, uncomplicated: Secondary | ICD-10-CM

## 2023-06-22 DIAGNOSIS — J4540 Moderate persistent asthma, uncomplicated: Secondary | ICD-10-CM

## 2023-06-22 DIAGNOSIS — Z3A36 36 weeks gestation of pregnancy: Secondary | ICD-10-CM

## 2023-06-22 DIAGNOSIS — O3510X0 Maternal care for (suspected) chromosomal abnormality in fetus, unspecified, not applicable or unspecified: Secondary | ICD-10-CM

## 2023-06-22 DIAGNOSIS — O99323 Drug use complicating pregnancy, third trimester: Secondary | ICD-10-CM | POA: Diagnosis not present

## 2023-06-22 DIAGNOSIS — Z68.41 Body mass index (BMI) pediatric, greater than or equal to 95th percentile for age: Secondary | ICD-10-CM

## 2023-06-22 DIAGNOSIS — O99213 Obesity complicating pregnancy, third trimester: Secondary | ICD-10-CM

## 2023-06-22 DIAGNOSIS — O09893 Supervision of other high risk pregnancies, third trimester: Secondary | ICD-10-CM

## 2023-06-22 MED ORDER — VENTOLIN HFA 108 (90 BASE) MCG/ACT IN AERS
1.0000 | INHALATION_SPRAY | RESPIRATORY_TRACT | 2 refills | Status: AC | PRN
Start: 2023-06-22 — End: ?

## 2023-06-22 NOTE — Progress Notes (Signed)
   PRENATAL VISIT NOTE  Subjective:  Janice Nicholson is a 22 y.o. G1P0 at [redacted]w[redacted]d being seen today for ongoing prenatal care.  She is currently monitored for the following issues for this low-risk pregnancy and has Elevated BMI (pBMI 37); Obesity affecting pregnancy; High risk teen pregnancy; Family history of Down syndrome; Marijuana use during pregnancy; Supervision of high risk pregnancy, antepartum; and Anemia in pregnancy on their problem list.  Patient doing well with no acute concerns today. She reports no complaints.  Contractions: Not present. Vag. Bleeding: None.  Movement: Present. Denies leaking of fluid.   The following portions of the patient's history were reviewed and updated as appropriate: allergies, current medications, past family history, past medical history, past social history, past surgical history and problem list. Problem list updated.  Objective:   Vitals:   06/22/23 1609  BP: 123/79  Pulse: 85  Weight: 229 lb (103.9 kg)    Fetal Status: Fetal Heart Rate (bpm): 139 Fundal Height: 37 cm Movement: Present  Presentation: Vertex (per pt s/p scan)  General:  Alert, oriented and cooperative. Patient is in no acute distress.  Skin: Skin is warm and dry. No rash noted.   Cardiovascular: Normal heart rate noted  Respiratory: Normal respiratory effort, no problems with respiration noted  Abdomen: Soft, gravid, appropriate for gestational age.  Pain/Pressure: Present     Pelvic: Cervical exam performed Dilation: Fingertip Effacement (%): 60 Station: Ballotable  Extremities: Normal range of motion.  Edema: None  Mental Status:  Normal mood and affect. Normal behavior. Normal judgment and thought content.   Assessment and Plan:  Pregnancy: G1P0 at [redacted]w[redacted]d  1. Supervision of high risk pregnancy, antepartum Continue routine prenatal care  - GC/Chlamydia probe amp ([place])not at [facility] - Culture, beta strep (group b only)  2. [redacted] weeks gestation of pregnancy   3.  Obesity affecting pregnancy in third trimester, unspecified obesity type   4. High risk teen pregnancy in third trimester   5. Elevated BMI (pBMI 37)   6. Moderate persistent asthma without complication Pt requested refill of inhaler  - albuterol (VENTOLIN HFA) 108 (90 Base) MCG/ACT inhaler; Inhale 1-2 puffs into the lungs every 4 (four) hours as needed for wheezing or shortness of breath.  Dispense: 18 g; Refill: 2  Preterm labor symptoms and general obstetric precautions including but not limited to vaginal bleeding, contractions, leaking of fluid and fetal movement were reviewed in detail with the patient.  Please refer to After Visit Summary for other counseling recommendations.   Return in about 1 week (around 06/29/2023) for [facility], in person.   Mariel Aloe, MD Faculty Attending Center for [facility]

## 2023-06-30 ENCOUNTER — Ambulatory Visit (INDEPENDENT_AMBULATORY_CARE_PROVIDER_SITE_OTHER): Payer: Medicaid Other | Admitting: Obstetrics and Gynecology

## 2023-06-30 ENCOUNTER — Encounter: Payer: Medicaid Other | Admitting: Obstetrics & Gynecology

## 2023-06-30 ENCOUNTER — Other Ambulatory Visit: Payer: Self-pay

## 2023-06-30 VITALS — BP 123/81 | HR 118 | Wt 230.5 lb

## 2023-06-30 DIAGNOSIS — O99213 Obesity complicating pregnancy, third trimester: Secondary | ICD-10-CM

## 2023-06-30 DIAGNOSIS — Z3A37 37 weeks gestation of pregnancy: Secondary | ICD-10-CM

## 2023-06-30 DIAGNOSIS — O09893 Supervision of other high risk pregnancies, third trimester: Secondary | ICD-10-CM

## 2023-06-30 DIAGNOSIS — O0990 Supervision of high risk pregnancy, unspecified, unspecified trimester: Secondary | ICD-10-CM

## 2023-06-30 NOTE — Progress Notes (Signed)
   PRENATAL VISIT NOTE  Subjective:  Janice Nicholson is a 22 y.o. G1P0 at [redacted]w[redacted]d being seen today for ongoing prenatal care.  She is currently monitored for the following issues for this high-risk pregnancy and has Elevated BMI (pBMI 37); Obesity affecting pregnancy; High risk teen pregnancy; Family history of Down syndrome; Marijuana use during pregnancy; Supervision of high risk pregnancy, antepartum; and Anemia in pregnancy on their problem list.  Patient reports no complaints.  Contractions: Irritability. Vag. Bleeding: None.  Movement: Present. Denies leaking of fluid.   The following portions of the patient's history were reviewed and updated as appropriate: allergies, current medications, past family history, past medical history, past social history, past surgical history and problem list.   Objective:   Vitals:   06/30/23 1559  BP: 123/81  Pulse: (!) 118  Weight: 230 lb 8 oz (104.6 kg)    Fetal Status: Fetal Heart Rate (bpm): 141 Fundal Height: 37 cm Movement: Present  Presentation: Vertex  General:  Alert, oriented and cooperative. Patient is in no acute distress.  Skin: Skin is warm and dry. No rash noted.   Cardiovascular: Normal heart rate noted  Respiratory: Normal respiratory effort, no problems with respiration noted  Abdomen: Soft, gravid, appropriate for gestational age.  Pain/Pressure: Present     Pelvic: Cervical exam performed in the presence of a chaperone Dilation: Closed Effacement (%): Thick Station: -3  Extremities: Normal range of motion.  Edema: None  Mental Status: Normal mood and affect. Normal behavior. Normal judgment and thought content.   Assessment and Plan:  Pregnancy: G1P0 at [redacted]w[redacted]d 1. Supervision of high risk pregnancy, antepartum 2. High risk teen pregnancy in third trimester BP and FHR normal Feeling regular fetal movement  3. Obesity affecting pregnancy in third trimester, unspecified obesity type 7/29 u/s nml, BPP 8/8  4. [redacted] weeks gestation  of pregnancy Desired sve 0/th/-3    Term labor symptoms and general obstetric precautions including but not limited to vaginal bleeding, contractions, leaking of fluid and fetal movement were reviewed in detail with the patient. Please refer to After Visit Summary for other counseling recommendations.    Albertine Grates, FNP

## 2023-07-01 ENCOUNTER — Encounter: Payer: Self-pay | Admitting: Lactation Services

## 2023-07-01 DIAGNOSIS — O99820 Streptococcus B carrier state complicating pregnancy: Secondary | ICD-10-CM | POA: Insufficient documentation

## 2023-07-06 ENCOUNTER — Ambulatory Visit (INDEPENDENT_AMBULATORY_CARE_PROVIDER_SITE_OTHER): Payer: Medicaid Other | Admitting: Family Medicine

## 2023-07-06 ENCOUNTER — Other Ambulatory Visit: Payer: Self-pay

## 2023-07-06 VITALS — BP 126/79 | HR 97 | Wt 228.4 lb

## 2023-07-06 DIAGNOSIS — O99820 Streptococcus B carrier state complicating pregnancy: Secondary | ICD-10-CM

## 2023-07-06 DIAGNOSIS — O0990 Supervision of high risk pregnancy, unspecified, unspecified trimester: Secondary | ICD-10-CM

## 2023-07-06 DIAGNOSIS — O0993 Supervision of high risk pregnancy, unspecified, third trimester: Secondary | ICD-10-CM

## 2023-07-06 DIAGNOSIS — O219 Vomiting of pregnancy, unspecified: Secondary | ICD-10-CM

## 2023-07-06 DIAGNOSIS — Z3A38 38 weeks gestation of pregnancy: Secondary | ICD-10-CM

## 2023-07-06 MED ORDER — ONDANSETRON 4 MG PO TBDP
4.0000 mg | ORAL_TABLET | Freq: Four times a day (QID) | ORAL | 2 refills | Status: DC | PRN
Start: 2023-07-06 — End: 2023-07-24

## 2023-07-06 NOTE — Progress Notes (Signed)
   PRENATAL VISIT NOTE  Subjective:  Janice Nicholson is a 22 y.o. G1P0 at [redacted]w[redacted]d being seen today for ongoing prenatal care.  She is currently monitored for the following issues for this low-risk pregnancy and has Elevated BMI (pBMI 37); Obesity affecting pregnancy; High risk teen pregnancy; Family history of Down syndrome; Marijuana use during pregnancy; Supervision of high risk pregnancy, antepartum; Anemia in pregnancy; and GBS (group B Streptococcus carrier), +RV culture, currently pregnant on their problem list.  Patient reports no complaints.  Contractions: Irritability. Vag. Bleeding: None.  Movement: Present. Denies leaking of fluid.   The following portions of the patient's history were reviewed and updated as appropriate: allergies, current medications, past family history, past medical history, past social history, past surgical history and problem list.   Objective:   Vitals:   07/06/23 1106  BP: 126/79  Pulse: 97  Weight: 228 lb 6.4 oz (103.6 kg)    Fetal Status: Fetal Heart Rate (bpm): 142 Fundal Height: 37 cm Movement: Present  Presentation: Vertex  General:  Alert, oriented and cooperative. Patient is in no acute distress.  Skin: Skin is warm and dry. No rash noted.   Cardiovascular: Normal heart rate noted  Respiratory: Normal respiratory effort, no problems with respiration noted  Abdomen: Soft, gravid, appropriate for gestational age.  Pain/Pressure: Absent     Pelvic: Cervical exam performed in the presence of a chaperone Dilation: 1 Effacement (%): 80 Station: -2  Extremities: Normal range of motion.  Edema: None  Mental Status: Normal mood and affect. Normal behavior. Normal judgment and thought content.   Assessment and Plan:  Pregnancy: G1P0 at [redacted]w[redacted]d 1. Supervision of high risk pregnancy, antepartum Continue routine prenatal care.  2. GBS (group B Streptococcus carrier), +RV culture, currently pregnant Will need treatment in labor  3. [redacted] weeks gestation of  pregnancy   4. Nausea/vomiting in pregnancy Refilled Zofran - ondansetron (ZOFRAN-ODT) 4 MG disintegrating tablet; Take 1 tablet (4 mg total) by mouth every 6 (six) hours as needed for nausea.  Dispense: 20 tablet; Refill: 2  Preterm labor symptoms and general obstetric precautions including but not limited to vaginal bleeding, contractions, leaking of fluid and fetal movement were reviewed in detail with the patient. Please refer to After Visit Summary for other counseling recommendations.   Return in 1 week (on 07/13/2023).  Future Appointments  Date Time Provider Department Center  07/28/2023  6:30 AM [facility] SCHED ROOM [facility] None    Reva Bores, MD

## 2023-07-13 ENCOUNTER — Ambulatory Visit (INDEPENDENT_AMBULATORY_CARE_PROVIDER_SITE_OTHER): Payer: Medicaid Other | Admitting: Obstetrics and Gynecology

## 2023-07-13 ENCOUNTER — Other Ambulatory Visit: Payer: Self-pay

## 2023-07-13 VITALS — BP 124/80 | HR 108 | Wt 229.6 lb

## 2023-07-13 DIAGNOSIS — O0990 Supervision of high risk pregnancy, unspecified, unspecified trimester: Secondary | ICD-10-CM | POA: Diagnosis not present

## 2023-07-13 DIAGNOSIS — Z3A39 39 weeks gestation of pregnancy: Secondary | ICD-10-CM | POA: Diagnosis not present

## 2023-07-13 DIAGNOSIS — O26893 Other specified pregnancy related conditions, third trimester: Secondary | ICD-10-CM | POA: Diagnosis not present

## 2023-07-13 DIAGNOSIS — R12 Heartburn: Secondary | ICD-10-CM

## 2023-07-13 DIAGNOSIS — O99820 Streptococcus B carrier state complicating pregnancy: Secondary | ICD-10-CM | POA: Diagnosis not present

## 2023-07-13 MED ORDER — FAMOTIDINE 20 MG PO TABS: 20.0000 mg | ORAL_TABLET | Freq: Every day | ORAL | 0 refills | Status: DC

## 2023-07-13 NOTE — Progress Notes (Signed)
   PRENATAL VISIT NOTE  Subjective:  Janice Nicholson is a 22 y.o. G1P0 at 106w0d being seen today for ongoing prenatal care.  She is currently monitored for the following issues for this low-risk pregnancy and has Elevated BMI (pBMI 37); Obesity affecting pregnancy; High risk teen pregnancy; Family history of Down syndrome; Marijuana use during pregnancy; Supervision of high risk pregnancy, antepartum; Anemia in pregnancy; and GBS (group B Streptococcus carrier), +RV culture, currently pregnant on their problem list.  Patient reports complaining of heartburn, medicine not helping.  Contractions: Irritability. Vag. Bleeding: None.  Movement: Present. Denies leaking of fluid.   The following portions of the patient's history were reviewed and updated as appropriate: allergies, current medications, past family history, past medical history, past social history, past surgical history and problem list.   Objective:   Vitals:   07/13/23 1538  BP: 124/80  Pulse: (!) 108  Weight: 229 lb 9.6 oz (104.1 kg)    Fetal Status: Fetal Heart Rate (bpm): 148 Fundal Height: 39 cm Movement: Present     General:  Alert, oriented and cooperative. Patient is in no acute distress.  Skin: Skin is warm and dry. No rash noted.   Cardiovascular: Normal heart rate noted  Respiratory: Normal respiratory effort, no problems with respiration noted  Abdomen: Soft, gravid, appropriate for gestational age.  Pain/Pressure: Present     Pelvic: Cervical exam performed in the presence of a chaperone Dilation: Fingertip Effacement (%): 80 Station: -2  Extremities: Normal range of motion.  Edema: None  Mental Status: Normal mood and affect. Normal behavior. Normal judgment and thought content.   Assessment and Plan:  Pregnancy: G1P0 at [redacted]w[redacted]d  1. Supervision of high risk pregnancy, antepartum BP and FHR normal Feeling regular fetal movement  2. GBS (group B Streptococcus carrier), +RV culture, currently pregnant Tx in labor    3. [redacted] weeks gestation of pregnancy Desired SVE 0.5/80/-2 Discussed IOL methods, labor precautions  4. Heartburn during pregnancy in third trimester Discussed increasing protonix, would like to try something else. Stop protonix and trial pepcid.   Term labor symptoms and general obstetric precautions including but not limited to vaginal bleeding, contractions, leaking of fluid and fetal movement were reviewed in detail with the patient. Please refer to After Visit Summary for other counseling recommendations.   Future Appointments  Date Time Provider Department Center  07/28/2023  6:30 AM [facility] SCHED ROOM [facility] None   Albertine Grates, FNP

## 2023-07-13 NOTE — Progress Notes (Signed)
Pt reports that Protonix is not helping her heartburn.

## 2023-07-20 ENCOUNTER — Encounter (HOSPITAL_COMMUNITY): Payer: Self-pay | Admitting: *Deleted

## 2023-07-20 ENCOUNTER — Telehealth (HOSPITAL_COMMUNITY): Payer: Self-pay | Admitting: *Deleted

## 2023-07-20 NOTE — Telephone Encounter (Signed)
Preadmission screen  

## 2023-07-21 ENCOUNTER — Inpatient Hospital Stay (HOSPITAL_COMMUNITY)
Admission: AD | Admit: 2023-07-21 | Discharge: 2023-07-24 | DRG: 806 | Disposition: A | Discharge status: Home / Self Care | Payer: Medicaid Other | Attending: Obstetrics and Gynecology | Admitting: Obstetrics and Gynecology

## 2023-07-21 ENCOUNTER — Other Ambulatory Visit: Payer: Self-pay

## 2023-07-21 ENCOUNTER — Encounter (HOSPITAL_COMMUNITY): Payer: Self-pay | Admitting: Obstetrics & Gynecology

## 2023-07-21 DIAGNOSIS — O99824 Streptococcus B carrier state complicating childbirth: Secondary | ICD-10-CM | POA: Diagnosis present

## 2023-07-21 DIAGNOSIS — O99820 Streptococcus B carrier state complicating pregnancy: Secondary | ICD-10-CM | POA: Diagnosis not present

## 2023-07-21 DIAGNOSIS — M549 Dorsalgia, unspecified: Secondary | ICD-10-CM | POA: Diagnosis not present

## 2023-07-21 DIAGNOSIS — O4202 Full-term premature rupture of membranes, onset of labor within 24 hours of rupture: Secondary | ICD-10-CM | POA: Diagnosis not present

## 2023-07-21 DIAGNOSIS — O26893 Other specified pregnancy related conditions, third trimester: Secondary | ICD-10-CM | POA: Diagnosis present

## 2023-07-21 DIAGNOSIS — O99214 Obesity complicating childbirth: Secondary | ICD-10-CM | POA: Diagnosis present

## 2023-07-21 DIAGNOSIS — J4540 Moderate persistent asthma, uncomplicated: Secondary | ICD-10-CM | POA: Diagnosis present

## 2023-07-21 DIAGNOSIS — O4292 Full-term premature rupture of membranes, unspecified as to length of time between rupture and onset of labor: Secondary | ICD-10-CM | POA: Diagnosis present

## 2023-07-21 DIAGNOSIS — Z8279 Family history of other congenital malformations, deformations and chromosomal abnormalities: Secondary | ICD-10-CM | POA: Diagnosis not present

## 2023-07-21 DIAGNOSIS — O9952 Diseases of the respiratory system complicating childbirth: Secondary | ICD-10-CM | POA: Diagnosis present

## 2023-07-21 DIAGNOSIS — Z3A40 40 weeks gestation of pregnancy: Secondary | ICD-10-CM

## 2023-07-21 DIAGNOSIS — R202 Paresthesia of skin: Secondary | ICD-10-CM | POA: Diagnosis not present

## 2023-07-21 DIAGNOSIS — O9902 Anemia complicating childbirth: Secondary | ICD-10-CM | POA: Diagnosis present

## 2023-07-21 DIAGNOSIS — O99324 Drug use complicating childbirth: Secondary | ICD-10-CM | POA: Diagnosis present

## 2023-07-21 DIAGNOSIS — O480 Post-term pregnancy: Secondary | ICD-10-CM | POA: Diagnosis present

## 2023-07-21 DIAGNOSIS — F1290 Cannabis use, unspecified, uncomplicated: Secondary | ICD-10-CM | POA: Diagnosis present

## 2023-07-21 DIAGNOSIS — Z880 Allergy status to penicillin: Secondary | ICD-10-CM

## 2023-07-21 DIAGNOSIS — O0990 Supervision of high risk pregnancy, unspecified, unspecified trimester: Principal | ICD-10-CM

## 2023-07-21 DIAGNOSIS — O99019 Anemia complicating pregnancy, unspecified trimester: Secondary | ICD-10-CM | POA: Diagnosis present

## 2023-07-21 DIAGNOSIS — O99210 Obesity complicating pregnancy, unspecified trimester: Secondary | ICD-10-CM | POA: Diagnosis present

## 2023-07-21 LAB — CBC
HCT: 35.8 % — ABNORMAL LOW (ref 36.0–46.0)
Hemoglobin: 10.9 g/dL — ABNORMAL LOW (ref 12.0–15.0)
MCH: 24.7 pg — ABNORMAL LOW (ref 26.0–34.0)
MCHC: 30.4 g/dL (ref 30.0–36.0)
MCV: 81.2 fL (ref 80.0–100.0)
Platelets: 332 10*3/uL (ref 150–400)
RBC: 4.41 MIL/uL (ref 3.87–5.11)
RDW: 14.3 % (ref 11.5–15.5)
WBC: 11 10*3/uL — ABNORMAL HIGH (ref 4.0–10.5)
nRBC: 0 % (ref 0.0–0.2)

## 2023-07-21 LAB — TYPE AND SCREEN
ABO/RH(D): O POS
Antibody Screen: NEGATIVE

## 2023-07-21 LAB — RUPTURE OF MEMBRANE (ROM)PLUS: Rom Plus: POSITIVE

## 2023-07-21 MED ORDER — SOD CITRATE-CITRIC ACID 500-334 MG/5ML PO SOLN: 30.0000 mL | ORAL | Status: DC | PRN

## 2023-07-21 MED ORDER — FAMOTIDINE 20 MG PO TABS
20.0000 mg | ORAL_TABLET | Freq: Every day | ORAL | Status: DC
  Filled 2023-07-21: qty 1

## 2023-07-21 MED ORDER — FLUTICASONE PROPIONATE 50 MCG/ACT NA SUSP
1.0000 | Freq: Every day | NASAL | Status: DC
  Administered 2023-07-23: 1 via NASAL
  Filled 2023-07-21: qty 16

## 2023-07-21 MED ORDER — MISOPROSTOL 50MCG HALF TABLET
50.0000 ug | ORAL_TABLET | Freq: Once | ORAL | Status: AC
  Administered 2023-07-21: 50 ug via ORAL
  Filled 2023-07-21: qty 1

## 2023-07-21 MED ORDER — PRENATAL MULTIVITAMIN CH: 1.0000 | ORAL_TABLET | Freq: Every day | ORAL | Status: DC

## 2023-07-21 MED ORDER — OXYTOCIN-SODIUM CHLORIDE 30-0.9 UT/500ML-% IV SOLN
1.0000 m[IU]/min | INTRAVENOUS | Status: DC
  Administered 2023-07-21: 2 m[IU]/min via INTRAVENOUS

## 2023-07-21 MED ORDER — MISOPROSTOL 25 MCG QUARTER TABLET
25.0000 ug | ORAL_TABLET | Freq: Once | ORAL | Status: AC
  Administered 2023-07-21: 25 ug via VAGINAL
  Filled 2023-07-21: qty 1

## 2023-07-21 MED ORDER — FLEET ENEMA RE ENEM: 1.0000 | ENEMA | RECTAL | Status: DC | PRN

## 2023-07-21 MED ORDER — LACTATED RINGERS IV SOLN: INTRAVENOUS | Status: DC

## 2023-07-21 MED ORDER — CEFAZOLIN SODIUM-DEXTROSE 1-4 GM/50ML-% IV SOLN: 1.0000 g | Freq: Three times a day (TID) | INTRAVENOUS | Status: DC

## 2023-07-21 MED ORDER — ACETAMINOPHEN 325 MG PO TABS: 650.0000 mg | ORAL_TABLET | ORAL | Status: DC | PRN

## 2023-07-21 MED ORDER — OXYTOCIN-SODIUM CHLORIDE 30-0.9 UT/500ML-% IV SOLN
2.5000 [IU]/h | INTRAVENOUS | Status: DC
  Administered 2023-07-22: 2.5 [IU]/h via INTRAVENOUS

## 2023-07-21 MED ORDER — OXYTOCIN BOLUS FROM INFUSION
333.0000 mL | Freq: Once | INTRAVENOUS | Status: AC
  Administered 2023-07-22: 333 mL via INTRAVENOUS

## 2023-07-21 MED ORDER — OXYTOCIN BOLUS FROM INFUSION: 333.0000 mL | Freq: Once | INTRAVENOUS | Status: DC

## 2023-07-21 MED ORDER — CEFAZOLIN SODIUM-DEXTROSE 2-4 GM/100ML-% IV SOLN
2.0000 g | Freq: Once | INTRAVENOUS | Status: AC
  Administered 2023-07-21: 2 g via INTRAVENOUS
  Filled 2023-07-21: qty 100

## 2023-07-21 MED ORDER — ACETAMINOPHEN 325 MG PO TABS
650.0000 mg | ORAL_TABLET | ORAL | Status: DC | PRN
  Administered 2023-07-22: 650 mg via ORAL
  Filled 2023-07-21: qty 2

## 2023-07-21 MED ORDER — OXYCODONE-ACETAMINOPHEN 5-325 MG PO TABS: 1.0000 | ORAL_TABLET | ORAL | Status: DC | PRN

## 2023-07-21 MED ORDER — CEFAZOLIN SODIUM-DEXTROSE 2-4 GM/100ML-% IV SOLN: 2.0000 g | Freq: Once | INTRAVENOUS | Status: DC

## 2023-07-21 MED ORDER — TERBUTALINE SULFATE 1 MG/ML IJ SOLN: 0.2500 mg | Freq: Once | INTRAMUSCULAR | Status: DC | PRN

## 2023-07-21 MED ORDER — CEFAZOLIN SODIUM-DEXTROSE 1-4 GM/50ML-% IV SOLN
1.0000 g | Freq: Three times a day (TID) | INTRAVENOUS | Status: DC
  Administered 2023-07-22: 1 g via INTRAVENOUS
  Filled 2023-07-21 (×4): qty 50

## 2023-07-21 MED ORDER — ONDANSETRON HCL 4 MG/2ML IJ SOLN
4.0000 mg | Freq: Four times a day (QID) | INTRAMUSCULAR | Status: DC | PRN
  Administered 2023-07-21 – 2023-07-22 (×2): 4 mg via INTRAVENOUS
  Filled 2023-07-21 (×3): qty 2

## 2023-07-21 MED ORDER — LIDOCAINE HCL (PF) 1 % IJ SOLN: 30.0000 mL | INTRAMUSCULAR | Status: DC | PRN

## 2023-07-21 MED ORDER — ALBUTEROL SULFATE (2.5 MG/3ML) 0.083% IN NEBU: 2.5000 mg | INHALATION_SOLUTION | RESPIRATORY_TRACT | Status: DC | PRN

## 2023-07-21 MED ORDER — LORATADINE 10 MG PO TABS
10.0000 mg | ORAL_TABLET | Freq: Every day | ORAL | Status: DC
  Administered 2023-07-21 – 2023-07-24 (×3): 10 mg via ORAL
  Filled 2023-07-21 (×3): qty 1

## 2023-07-21 MED ORDER — LACTATED RINGERS IV SOLN: 500.0000 mL | INTRAVENOUS | Status: DC | PRN

## 2023-07-21 MED ORDER — OXYCODONE-ACETAMINOPHEN 5-325 MG PO TABS: 2.0000 | ORAL_TABLET | ORAL | Status: DC | PRN

## 2023-07-21 MED ORDER — OXYTOCIN-SODIUM CHLORIDE 30-0.9 UT/500ML-% IV SOLN
2.5000 [IU]/h | INTRAVENOUS | Status: DC
  Filled 2023-07-21: qty 500

## 2023-07-21 MED ORDER — FENTANYL CITRATE (PF) 100 MCG/2ML IJ SOLN
100.0000 ug | INTRAMUSCULAR | Status: DC | PRN
  Administered 2023-07-21 – 2023-07-22 (×4): 100 ug via INTRAVENOUS
  Filled 2023-07-21 (×4): qty 2

## 2023-07-21 MED ORDER — ONDANSETRON HCL 4 MG/2ML IJ SOLN: 4.0000 mg | Freq: Four times a day (QID) | INTRAMUSCULAR | Status: DC | PRN

## 2023-07-21 NOTE — MAU Note (Signed)
.  Janice Nicholson is a 22 y.o. at [redacted]w[redacted]d here in MAU reporting: CTX's since yesterday that are currently every 6 minutes. She reports around 1215 today when she used the restroom she noted a large blood clot on her tissue. She reports leaking fluids since early this morning. +FM but reports it has been decreased since 1000.   GBS+. IOL scheduled for 9/3. 0.580-2  cm on 8/19.  Pain score: 7/10 lower abdomen   FHT: 135 initial external  Lab orders placed from triage:  MAU Labor Eval

## 2023-07-21 NOTE — MAU Provider Note (Signed)
S: Janice Nicholson is a 22 y.o. G1P0 at [redacted]w[redacted]d  who presents to MAU today complaining of leaking of fluid since early this AM. She endorses vaginal bleeding. She endorses contractions. She reports normal fetal movement.    O: BP 130/82 (BP Location: Right Arm)   Pulse 88   Temp 98.1 F (36.7 C) (Oral)   Resp 20   LMP 10/13/2022   SpO2 99%  GENERAL: Well-developed, well-nourished female in no acute distress.  HEAD: Normocephalic, atraumatic.  CHEST: Normal effort of breathing, regular heart rate ABDOMEN: Soft, nontender, gravid PELVIC: RON Plus Swab collected by RN using blind swab technique   Cervical exam:     Fetal Monitoring: Baseline: 135 bpm Variability: moderate Accelerations: present Decelerations: absent Contractions: irregular UC's  Results for orders placed or performed during the hospital encounter of 07/21/23 (from the past 24 hour(s))  Rupture of Membrane (ROM) Plus     Status: None   Collection Time: 07/21/23  2:05 PM  Result Value Ref Range   Rom Plus POSITIVE      A: SIUP at [redacted]w[redacted]d  SROM  P: Admit to L&D Advised RN to call labor team member for admission orders  Raelyn Mora, CNM 07/21/2023, 2:52 PM

## 2023-07-21 NOTE — Progress Notes (Signed)
Pt showering per MD permission.

## 2023-07-21 NOTE — H&P (Cosign Needed Addendum)
OBSTETRIC ADMISSION HISTORY AND PHYSICAL  Janice Nicholson is a 22 y.o. female G1P0 with IUP at [redacted]w[redacted]d by LMP presenting for IOL 2/2 SROM. She reports ctx since yesterday, currently q18min. At 1215 today, she reported a large blood clot on her tissue. She reports +Fms but decreased since 1000, No LOF, no VB, no blurry vision, headaches or peripheral edema, and RUQ pain.  She plans on breast feeding. She undecided for birth control, declines ppIUD.   She received her prenatal [facility] at  [facility]    Dating: By LMP --->  Estimated Date of Delivery: 07/20/23  Sono:    @[redacted]w[redacted]d , CWD, normal anatomy, posterior placenta,  Est. FW: 2775 gm 6 lb 2 oz 46 %    Prenatal History/Complications:  Teen pregnancy GBS+ w/ PCN allx (anaphylaxis) BMI 38 THC use in pregnancy Family hx of chromosomal abnormality (sister T21) Hx of Asthma Anemia in pregnancy   Past Medical History: Past Medical History:  Diagnosis Date   Acute biliary pancreatitis 07/24/2022   ADHD (attention deficit hyperactivity disorder), combined type 03/10/2016   Adjustment disorder with depressed mood 03/10/2016   Asthma    Attention deficit hyperactivity disorder (ADHD)    Environmental allergies    Gallstone pancreatitis 07/22/2022   Generalized anxiety disorder 03/10/2016   Lactose intolerance    Moderate persistent asthma without complication 08/22/2019   Last Assessment & Plan:   Resume maintenance therapy, this time with Advair Diskus, as Flovent was ineffective historically.   Nausea 05/26/2023   Primary insomnia 08/22/2019   Last Assessment & Plan:   Managed by Renee Pain at [facility]   UTI (urinary tract infection)     Past Surgical History: Past Surgical History:  Procedure Laterality Date   ADENOIDECTOMY     CHOLECYSTECTOMY N/A 07/23/2022   Procedure: LAPAROSCOPIC CHOLECYSTECTOMY WITH INTRAOPERATIVE CHOLANGIOGRAM;  Surgeon: Griselda Miner, MD;  Location: [facility]S;  Service: General;  Laterality: N/A;    TONSILLECTOMY AND ADENOIDECTOMY      Obstetrical History: OB History     Gravida  1   Para      Term      Preterm      AB      Living         SAB      IAB      Ectopic      Multiple      Live Births              Social History Social History   Socioeconomic History   Marital status: Single    Spouse name: Not on file   Number of children: Not on file   Years of education: Not on file   Highest education level: Not on file  Occupational History   Not on file  Tobacco Use   Smoking status: Never    Passive exposure: Yes   Smokeless tobacco: Never  Vaping Use   Vaping status: Former  Substance and Sexual Activity   Alcohol use: Not Currently   Drug use: Not Currently    Types: Marijuana    Comment: + 01/08/23   Sexual activity: Not Currently    Birth control/protection: Injection  Other Topics Concern   Not on file  Social History Narrative   Not on file   Social Determinants of Health   Financial Resource Strain: Not on file  Food Insecurity: Food Insecurity Present (05/19/2023)   Hunger Vital Sign    Worried About Running Out of Food  in the Last Year: Often true    Ran Out of Food in the Last Year: Often true  Transportation Needs: No Transportation Needs (05/19/2023)   PRAPARE - Administrator, Civil Service (Medical): No    Lack of Transportation (Non-Medical): No  Physical Activity: Not on file  Stress: Not on file  Social Connections: Unknown (04/08/2022)   [facility]   Social Network    Social Network: Not on file    Family History: Family History  Problem Relation Age of Onset   Asthma Mother    Hypertension Mother    Diabetes Father    Diabetes Maternal Grandfather    Stroke Maternal Grandfather    Hypertension Maternal Grandfather    Heart disease Maternal Grandfather    Diabetes Paternal Grandfather     Allergies: Allergies  Allergen Reactions   Lactose Intolerance (Gi)  Diarrhea and Rash   Egg-Derived Products Nausea And Vomiting   Molds & Smuts Other (See Comments)    Triggers asthma   Penicillins Other (See Comments)    Family history of this- Almost killed the patient's mother and grandmother    Medications Prior to Admission  Medication Sig Dispense Refill Last Dose   acetaminophen (TYLENOL) 325 MG tablet Take 325-650 mg by mouth every 6 (six) hours as needed for mild pain, headache or fever.   Past Week   famotidine (PEPCID) 20 MG tablet Take 1 tablet (20 mg total) by mouth at bedtime. 30 tablet 0 07/20/2023 at 2230   ondansetron (ZOFRAN-ODT) 4 MG disintegrating tablet Take 1 tablet (4 mg total) by mouth every 6 (six) hours as needed for nausea. 20 tablet 2 07/20/2023 at 1300   Prenatal Vit-Fe Fumarate-FA (PRENATAL VITAMIN PO) Take by mouth.   07/20/2023 at 2230   albuterol (VENTOLIN HFA) 108 (90 Base) MCG/ACT inhaler Inhale 1-2 puffs into the lungs every 4 (four) hours as needed for wheezing or shortness of breath. 18 g 2    cetirizine (ZYRTEC) 10 MG tablet Take 1 tablet (10 mg total) by mouth daily. (Patient taking differently: Take 10 mg by mouth daily as needed for allergies or rhinitis.) 30 tablet 0    fluticasone (FLONASE) 50 MCG/ACT nasal spray Place 1 spray into both nostrils daily. 16 g 0    ondansetron (ZOFRAN) 4 MG tablet Take 1 tablet (4 mg total) by mouth every 8 (eight) hours as needed for nausea or vomiting. (Patient not taking: Reported on 07/06/2023) 20 tablet 0    phenylephrine ([employer] NASAL SPRAY) 1 % nasal spray PLACE 2 SPRAYS INTO BOTH NOSTRILS AS NEEDED FOR UP TO 7 DAYS FOR CONGESTION. (Patient not taking: Reported on 06/30/2023) 1 mL 0      Review of Systems   All systems reviewed and negative except as stated in HPI  Blood pressure 130/82, pulse 88, temperature 98.1 F (36.7 C), temperature source Oral, resp. rate 20, last menstrual period 10/13/2022, SpO2 99%. General appearance: alert, cooperative, appears stated age, and no  distress Lungs: clear to auscultation bilaterally Heart: regular rate and rhythm Abdomen: soft, non-tender; bowel sounds normal, gravid Extremities: Homans sign is negative, no sign of DVT Presentation: cephalic Fetal monitoringBaseline: 140 bpm, Variability: Good {> 6 bpm), Accelerations: Reactive, and Decelerations: Absent Uterine activityDate/time of onset: since yesterday, q47min Dilation: 1.5 Effacement (%): 20 Station: -3 Exam by:: Georgina Snell, RN   Prenatal labs: ABO, Rh: --/--/PENDING (08/27 1500) Antibody: PENDING (08/27 1500) Rubella: Immune (02/15 0000) RPR: Nonreactive (02/15  0000)  HBsAg: Negative (02/15 0000)  HIV: Non-reactive (02/15 0000)  GBS: Positive/-- (07/30 1137)  1 hr Glucola nl Genetic screening  declined Anatomy US nl  Prenatal Transfer Tool  Maternal Diabetes: No Genetic Screening: Declined Maternal Ultrasounds/Referrals: Normal Fetal Ultrasounds or other Referrals:  None Maternal Substance Abuse:  Yes:  Type: Marijuana Significant Maternal Medications:  None Significant Maternal Lab Results:  Group B Strep positive Number of Prenatal Visits:greater than 3 verified prenatal visits Other Comments:  None  Results for orders placed or performed during the hospital encounter of 07/21/23 (from the past 24 hour(s))  Rupture of Membrane (ROM) Plus   Collection Time: 07/21/23  2:05 PM  Result Value Ref Range   Rom Plus POSITIVE   Type and screen   Collection Time: 07/21/23  3:00 PM  Result Value Ref Range   ABO/RH(D) PENDING    Antibody Screen PENDING    Sample Expiration      07/24/2023,2359 Performed at [facility], [address], [place], [place] [address]     Patient Active Problem List   Diagnosis Date Noted   GBS (group B Streptococcus carrier), +RV culture, currently pregnant 07/01/2023   Anemia in pregnancy 05/20/2023   Supervision of high risk pregnancy, antepartum 05/19/2023   Obesity affecting pregnancy 03/09/2023    High risk teen pregnancy 03/09/2023   Family history of Down syndrome 03/09/2023   Marijuana use during pregnancy 03/09/2023   Elevated BMI (pBMI 37) 08/22/2019    Assessment/Plan:  Janice Nicholson is a 22 y.o. G1P0 at [redacted]w[redacted]d here for IOL 2/2 SROM.  #Labor:IOL, Cyctotec.  Consider Foley in 4 hrs.  #Pain: Pt unsure, nurse to provide counseling.  Pt concerned about epidural d/t mom w/multiple epidurals and hx of back surgery. #FWB: Cat I #ID:  GBS +, ancef d/t allx to PCN (anaphylaxis) #MOF: breast #MOC: unsure, declines ppIUD #Circ:  Girl, n/a  Paschal Dopp, MD  07/21/2023, 4:09 PM  GME ATTESTATION:  Evaluation and management procedures were performed by the [facility] Medicine Resident under my supervision. I was immediately available for direct supervision, assistance and direction throughout this encounter.  I also confirm that I have verified the information documented in the resident's note, and that I have also personally reperformed the pertinent components of the physical exam and all of the medical decision making activities.  I have also made any necessary editorial changes.  Wyn Forster, MD OB Fellow, Faculty [facility], Center for [facility] 07/22/2023 10:02 AM

## 2023-07-22 ENCOUNTER — Inpatient Hospital Stay (HOSPITAL_COMMUNITY): Payer: Medicaid Other | Admitting: Anesthesiology

## 2023-07-22 ENCOUNTER — Encounter (HOSPITAL_COMMUNITY): Payer: Self-pay | Admitting: Family Medicine

## 2023-07-22 DIAGNOSIS — O99820 Streptococcus B carrier state complicating pregnancy: Secondary | ICD-10-CM

## 2023-07-22 DIAGNOSIS — O480 Post-term pregnancy: Secondary | ICD-10-CM

## 2023-07-22 DIAGNOSIS — Z3A40 40 weeks gestation of pregnancy: Secondary | ICD-10-CM

## 2023-07-22 DIAGNOSIS — O4202 Full-term premature rupture of membranes, onset of labor within 24 hours of rupture: Secondary | ICD-10-CM

## 2023-07-22 DIAGNOSIS — O99324 Drug use complicating childbirth: Secondary | ICD-10-CM

## 2023-07-22 LAB — RPR: RPR Ser Ql: NONREACTIVE

## 2023-07-22 MED ORDER — WITCH HAZEL-GLYCERIN EX PADS: 1.0000 | MEDICATED_PAD | CUTANEOUS | Status: DC | PRN

## 2023-07-22 MED ORDER — PRENATAL MULTIVITAMIN CH
1.0000 | ORAL_TABLET | Freq: Every day | ORAL | Status: DC
  Administered 2023-07-22 – 2023-07-24 (×3): 1 via ORAL
  Filled 2023-07-22 (×3): qty 1

## 2023-07-22 MED ORDER — SENNOSIDES-DOCUSATE SODIUM 8.6-50 MG PO TABS
2.0000 | ORAL_TABLET | ORAL | Status: DC
  Administered 2023-07-22 – 2023-07-24 (×3): 2 via ORAL
  Filled 2023-07-22 (×3): qty 2

## 2023-07-22 MED ORDER — ACETAMINOPHEN 325 MG PO TABS
650.0000 mg | ORAL_TABLET | ORAL | Status: DC | PRN
  Administered 2023-07-22 – 2023-07-24 (×4): 650 mg via ORAL
  Filled 2023-07-22 (×5): qty 2

## 2023-07-22 MED ORDER — SIMETHICONE 80 MG PO CHEW: 80.0000 mg | CHEWABLE_TABLET | ORAL | Status: DC | PRN

## 2023-07-22 MED ORDER — ONDANSETRON HCL 4 MG PO TABS: 4.0000 mg | ORAL_TABLET | ORAL | Status: DC | PRN

## 2023-07-22 MED ORDER — LIDOCAINE HCL (PF) 1 % IJ SOLN
INTRAMUSCULAR | Status: DC | PRN
  Administered 2023-07-22: 2 mL via EPIDURAL
  Administered 2023-07-22: 10 mL via EPIDURAL

## 2023-07-22 MED ORDER — PHENYLEPHRINE 80 MCG/ML (10ML) SYRINGE FOR IV PUSH (FOR BLOOD PRESSURE SUPPORT): 80.0000 ug | PREFILLED_SYRINGE | INTRAVENOUS | Status: DC | PRN

## 2023-07-22 MED ORDER — IBUPROFEN 600 MG PO TABS
600.0000 mg | ORAL_TABLET | Freq: Four times a day (QID) | ORAL | Status: DC
  Administered 2023-07-22 – 2023-07-24 (×9): 600 mg via ORAL
  Filled 2023-07-22 (×9): qty 1

## 2023-07-22 MED ORDER — SODIUM CHLORIDE 0.9% FLUSH: 3.0000 mL | INTRAVENOUS | Status: DC | PRN

## 2023-07-22 MED ORDER — DIBUCAINE (PERIANAL) 1 % EX OINT: 1.0000 | TOPICAL_OINTMENT | CUTANEOUS | Status: DC | PRN

## 2023-07-22 MED ORDER — ONDANSETRON HCL 4 MG/2ML IJ SOLN: 4.0000 mg | INTRAMUSCULAR | Status: DC | PRN

## 2023-07-22 MED ORDER — COCONUT OIL OIL: 1.0000 | TOPICAL_OIL | Status: DC | PRN

## 2023-07-22 MED ORDER — DIPHENHYDRAMINE HCL 50 MG/ML IJ SOLN: 12.5000 mg | INTRAMUSCULAR | Status: DC | PRN

## 2023-07-22 MED ORDER — LACTATED RINGERS IV SOLN: 500.0000 mL | Freq: Once | INTRAVENOUS | Status: DC

## 2023-07-22 MED ORDER — EPHEDRINE 5 MG/ML INJ: 10.0000 mg | INTRAVENOUS | Status: DC | PRN

## 2023-07-22 MED ORDER — DIPHENHYDRAMINE HCL 25 MG PO CAPS: 25.0000 mg | ORAL_CAPSULE | Freq: Four times a day (QID) | ORAL | Status: DC | PRN

## 2023-07-22 MED ORDER — FAMOTIDINE IN NACL 20-0.9 MG/50ML-% IV SOLN
20.0000 mg | Freq: Two times a day (BID) | INTRAVENOUS | Status: DC | PRN
  Administered 2023-07-22: 20 mg via INTRAVENOUS
  Filled 2023-07-22: qty 50

## 2023-07-22 MED ORDER — SODIUM CHLORIDE 0.9 % IV SOLN: 250.0000 mL | INTRAVENOUS | Status: DC | PRN

## 2023-07-22 MED ORDER — FENTANYL-BUPIVACAINE-NACL 0.5-0.125-0.9 MG/250ML-% EP SOLN
EPIDURAL | Status: DC | PRN
  Administered 2023-07-22: 12 mL/h via EPIDURAL

## 2023-07-22 MED ORDER — SODIUM CHLORIDE 0.9% FLUSH: 3.0000 mL | Freq: Two times a day (BID) | INTRAVENOUS | Status: DC

## 2023-07-22 MED ORDER — ZOLPIDEM TARTRATE 5 MG PO TABS: 5.0000 mg | ORAL_TABLET | Freq: Every evening | ORAL | Status: DC | PRN

## 2023-07-22 MED ORDER — BENZOCAINE-MENTHOL 20-0.5 % EX AERO
1.0000 | INHALATION_SPRAY | CUTANEOUS | Status: DC | PRN
  Administered 2023-07-22 (×2): 1 via TOPICAL
  Filled 2023-07-22 (×2): qty 56

## 2023-07-22 MED ORDER — MEASLES, MUMPS & RUBELLA VAC IJ SOLR: 0.5000 mL | Freq: Once | INTRAMUSCULAR | Status: DC

## 2023-07-22 MED ORDER — FENTANYL-BUPIVACAINE-NACL 0.5-0.125-0.9 MG/250ML-% EP SOLN
12.0000 mL/h | EPIDURAL | Status: DC | PRN
  Filled 2023-07-22: qty 250

## 2023-07-22 NOTE — Anesthesia Procedure Notes (Signed)
Epidural Patient location during procedure: OB Start time: 07/22/2023 2:15 AM End time: 07/22/2023 2:22 AM  Staffing Anesthesiologist: Lannie Fields, DO Performed: anesthesiologist   Preanesthetic Checklist Completed: patient identified, IV checked, risks and benefits discussed, monitors and equipment checked, pre-op evaluation and timeout performed  Epidural Patient position: sitting Prep: DuraPrep and site prepped and draped Patient monitoring: continuous pulse ox, blood pressure, heart rate and cardiac monitor Approach: midline Location: L3-L4 Injection technique: LOR air  Needle:  Needle type: Tuohy  Needle gauge: 17 G Needle length: 9 cm Needle insertion depth: 7 cm Catheter type: closed end flexible Catheter size: 19 Gauge Catheter at skin depth: 12 cm Test dose: negative  Assessment Sensory level: T8 Events: blood not aspirated, no cerebrospinal fluid, injection not painful, no injection resistance, no paresthesia and negative IV test  Additional Notes Patient identified. Risks/Benefits/Options discussed with patient including but not limited to bleeding, infection, nerve damage, paralysis, failed block, incomplete pain control, headache, blood pressure changes, nausea, vomiting, reactions to medication both or allergic, itching and postpartum back pain. Confirmed with bedside nurse the patient's most recent platelet count. Confirmed with patient that they are not currently taking any anticoagulation, have any bleeding history or any family history of bleeding disorders. Patient expressed understanding and wished to proceed. All questions were answered. Sterile technique was used throughout the entire procedure. Please see nursing notes for vital signs. Test dose was given through epidural catheter and negative prior to continuing to dose epidural or start infusion. Warning signs of high block given to the patient including shortness of breath, tingling/numbness in  hands, complete motor block, or any concerning symptoms with instructions to call for help. Patient was given instructions on fall risk and not to get out of bed. All questions and concerns addressed with instructions to call with any issues or inadequate analgesia.  Reason for block:procedure for pain

## 2023-07-22 NOTE — Lactation Note (Signed)
This note was copied from a baby's chart. Lactation Consultation Note  Patient Name: Janice Nicholson Today's Date: 07/22/2023 Age:20 hours Reason for consult: Initial assessment;1st time breastfeeding;Term.  Per Birth Parent, BF is going well no concerns for LC today,  infant recently BF for 10 minutes at 1630 pm. Birth Parent participate in BF classes during pregnancy with the [facility] and has a Careers information officer. Birth Parent will continue to BF infant by cues, on demand, every 2 to 3 hours, skin to skin. Birth Parent already knows how to hand express. LC discussed infant's input and output and infant had one void diaper since birth. LC will send Stork Breast Pump today per Birth Parent request. Birth Parent was made aware of O/P services, breastfeeding support groups, community resources, and our phone # for post-discharge questions.    Current feeding plan:  1- Continue to BF infant by cues, on demand, every 2-3 hours, lots of skin to skin. 2-Call RN/LC for latch assistance if needed. 3- LC advised Birth Parent to have maternal rest, stay hydrated and diet.   Maternal Data Has patient been taught Hand Expression?: Yes Does the patient have breastfeeding experience prior to this delivery?: No  Feeding Mother's Current Feeding Choice: Breast Milk  LATCH Score  LC did not observe latch due infant recently BF prior to [employer] entering the room.                  Lactation Tools Discussed/Used    Interventions Interventions: Breast feeding basics reviewed;Skin to skin;Breast compression;Position options;Education  Discharge Pump:  (LC will send Referral for Ephriam Knuckles at [facility] Parent request.) [facility] Program: Yes  Consult Status Consult Status: Follow-up Date: 07/23/23 Follow-up type: In-patient    Frederico Hamman 07/22/2023, 6:10 PM

## 2023-07-22 NOTE — Anesthesia Preprocedure Evaluation (Addendum)
Anesthesia Evaluation  Patient identified by MRN, date of birth, ID band Patient awake    Reviewed: Allergy & Precautions, Patient's Chart, lab work & pertinent test results  Airway Mallampati: III  TM Distance: >3 FB Neck ROM: Full    Dental no notable dental hx.    Pulmonary asthma    Pulmonary exam normal breath sounds clear to auscultation       Cardiovascular negative cardio ROS Normal cardiovascular exam Rhythm:Regular Rate:Normal     Neuro/Psych  PSYCHIATRIC DISORDERS Anxiety     negative neurological ROS     GI/Hepatic negative GI ROS, Neg liver ROS,,,  Endo/Other  Obesity BMI 38  Renal/GU negative Renal ROS  negative genitourinary   Musculoskeletal negative musculoskeletal ROS (+)    Abdominal  (+) + obese  Peds negative pediatric ROS (+)  Hematology  (+) Blood dyscrasia, anemia Hb 10.9, plt 332   Anesthesia Other Findings   Reproductive/Obstetrics (+) Pregnancy                             Anesthesia Physical Anesthesia Plan  ASA: 2  Anesthesia Plan: Epidural   Post-op Pain Management:    Induction:   PONV Risk Score and Plan: 2  Airway Management Planned: Natural Airway  Additional Equipment: None  Intra-op Plan:   Post-operative Plan:   Informed Consent: I have reviewed the patients History and Physical, chart, labs and discussed the procedure including the risks, benefits and alternatives for the proposed anesthesia with the patient or authorized representative who has indicated his/her understanding and acceptance.       Plan Discussed with:   Anesthesia Plan Comments:        Anesthesia Quick Evaluation

## 2023-07-22 NOTE — Discharge Summary (Addendum)
Postpartum Discharge Summary  Date of Service updated 07/22/2023     Patient Name: Janice Nicholson DOB: June 12, 2001 MRN: [id number]  Date of admission: 07/21/2023 Delivery date:07/22/2023 Delivering provider: CHUBB, CASEY C Date of discharge: 07/24/2023  Admitting diagnosis: Leakage of amniotic fluid [O42.90] Post term pregnancy over 40 weeks [O48.0] Intrauterine pregnancy: [redacted]w[redacted]d     Secondary diagnosis:  Active Problems:   Obesity affecting pregnancy   Anemia in pregnancy   SVD (spontaneous vaginal delivery)  Additional problems: PROM    Discharge diagnosis: Term Pregnancy Delivered                                              Post partum procedures: none Augmentation: Cytotec Complications: None  Hospital course: Onset of Labor With Vaginal Delivery      [name] at [redacted]w[redacted]d was admitted in Latent Labor on 07/21/2023. Labor course was complicated by nothing  Membrane Rupture Time/Date: 9:30 AM,07/21/2023  Delivery Method:Vaginal, Spontaneous Operative Delivery:N/A Episiotomy: None Lacerations:  1st degree;Periurethral Patient had a postpartum course complicated by nothing.  She is ambulating, tolerating a regular diet, passing flatus, and urinating well. Patient is discharged home in stable condition on 07/24/23. Discharged on iron for anemia.  Newborn Data: Birth date:07/22/2023 Birth time:8:48 AM Gender:Female Living status:Living Apgars:9 ,9  Weight:3660 g  Magnesium Sulfate received: No BMZ received: No Rhophylac:No Transfusion:No  Physical exam  Vitals:   07/22/23 2332 07/23/23 0509 07/24/23 0104 07/24/23 0531  BP: (!) 116/54 117/72 (!) 109/56 118/66  Pulse: 81 80 89 90  Resp: 16 17 18 16   Temp: 97.8 F (36.6 C) 97.7 F (36.5 C) 98.3 F (36.8 C) 98.6 F (37 C)  TempSrc: Oral Oral Oral Oral  SpO2:   99%   Weight:      Height:       General: alert, cooperative, and no distress Lochia: appropriate Uterine Fundus: firm Incision: N/A DVT  Evaluation: No evidence of DVT seen on physical exam. Labs: Lab Results  Component Value Date   WBC 14.6 (H) 07/23/2023   HGB 8.9 (L) 07/23/2023   HCT 28.7 (L) 07/23/2023   MCV 81.5 07/23/2023   PLT 313 07/23/2023      Latest Ref Rng & Units 03/10/2023    8:34 PM  CMP  Glucose 70 - 99 mg/dL 78   BUN 6 - 20 mg/dL 5   Creatinine 7.25 - 3.66 mg/dL 4.40   Sodium 347 - 425 mmol/L 137   Potassium 3.5 - 5.1 mmol/L 3.5   Chloride 98 - 111 mmol/L 103   CO2 22 - 32 mmol/L 24   Calcium 8.9 - 10.3 mg/dL 9.2   Total Protein 6.5 - 8.1 g/dL 6.9   Total Bilirubin 0.3 - 1.2 mg/dL 0.4   Alkaline Phos 38 - 126 U/L 94   AST 15 - 41 U/L 26   ALT 0 - 44 U/L 13    Edinburgh Score:    07/23/2023    8:05 AM  Edinburgh Postnatal Depression Scale Screening Tool  I have been able to laugh and see the funny side of things. 0  I have looked forward with enjoyment to things. 1  I have blamed myself unnecessarily when things went wrong. 1  I have been anxious or worried for no good reason. 1  I have felt scared or panicky for no  good reason. 0  Things have been getting on top of me. 1  I have been so unhappy that I have had difficulty sleeping. 0  I have felt sad or miserable. 0  I have been so unhappy that I have been crying. 0  The thought of harming myself has occurred to me. 0  Edinburgh Postnatal Depression Scale Total 4     After visit meds:  Allergies as of 07/24/2023       Reactions   Lactose Intolerance (gi) Diarrhea, Rash   Egg-derived Products Nausea And Vomiting   Molds & Smuts Other (See Comments)   Triggers asthma   Penicillins Other (See Comments)   Family history of this- Almost killed the patient's mother and grandmother        Medication List     STOP taking these medications    [facility] Nasal Spray 1 % nasal spray Generic drug: phenylephrine   famotidine 20 MG tablet Commonly known as: PEPCID   ondansetron 4 MG disintegrating tablet Commonly known as: ZOFRAN-ODT    ondansetron 4 MG tablet Commonly known as: Zofran       TAKE these medications    acetaminophen 325 MG tablet Commonly known as: TYLENOL Take 325-650 mg by mouth every 6 (six) hours as needed for mild pain, headache or fever.   cetirizine 10 MG tablet Commonly known as: ZYRTEC Take 1 tablet (10 mg total) by mouth daily. What changed:  when to take this reasons to take this   ferrous sulfate 325 (65 FE) MG EC tablet Take 1 tablet (325 mg total) by mouth every other day.   fluticasone 50 MCG/ACT nasal spray Commonly known as: FLONASE Place 1 spray into both nostrils daily.   ibuprofen 600 MG tablet Commonly known as: ADVIL Take 1 tablet (600 mg total) by mouth every 6 (six) hours.   PRENATAL VITAMIN PO Take by mouth.   Ventolin HFA 108 (90 Base) MCG/ACT inhaler Generic drug: albuterol Inhale 1-2 puffs into the lungs every 4 (four) hours as needed for wheezing or shortness of breath.         Discharge home in stable condition Infant Feeding: Bottle and Breast Infant Disposition:home with mother Discharge instruction: per After Visit Summary and Postpartum booklet. Activity: Advance as tolerated. Pelvic rest for 6 weeks.  Diet: routine diet Future Appointments:No future appointments. Follow up Visit:   Please schedule this patient for a In person postpartum visit in 6 weeks with the following provider: Any provider. Additional Postpartum F/U: nothing   Low risk pregnancy complicated by:  nothing Delivery mode:  Vaginal, Spontaneous Anticipated Birth Control:   remains undecided   07/24/2023 Silvano Bilis, MD

## 2023-07-23 LAB — CBC
HCT: 28.7 % — ABNORMAL LOW (ref 36.0–46.0)
Hemoglobin: 8.9 g/dL — ABNORMAL LOW (ref 12.0–15.0)
MCH: 25.3 pg — ABNORMAL LOW (ref 26.0–34.0)
MCHC: 31 g/dL (ref 30.0–36.0)
MCV: 81.5 fL (ref 80.0–100.0)
Platelets: 313 10*3/uL (ref 150–400)
RBC: 3.52 MIL/uL — ABNORMAL LOW (ref 3.87–5.11)
RDW: 14.7 % (ref 11.5–15.5)
WBC: 14.6 10*3/uL — ABNORMAL HIGH (ref 4.0–10.5)
nRBC: 0 % (ref 0.0–0.2)

## 2023-07-23 MED ORDER — FERROUS SULFATE 325 (65 FE) MG PO TABS
325.0000 mg | ORAL_TABLET | ORAL | Status: DC
  Administered 2023-07-24: 325 mg via ORAL
  Filled 2023-07-23: qty 1

## 2023-07-23 MED ORDER — CYCLOBENZAPRINE HCL 5 MG PO TABS: 10.0000 mg | ORAL_TABLET | Freq: Three times a day (TID) | ORAL | Status: DC | PRN

## 2023-07-23 NOTE — Progress Notes (Signed)
MOB had a pillow in the crib with baby laying on top when I entered the room. Baby was then picked up by MOB to change her diaper. I advised that baby needed to sleep on a firm flat surface to decrease risk of SIDS.MOB verbalized understanding that pillow needed to be removed for safety.

## 2023-07-23 NOTE — Progress Notes (Addendum)
POSTPARTUM PROGRESS NOTE  Post Partum Day 1  Subjective:  Janice Nicholson is a 22 y.o. G1P1001 s/p SVD at [redacted]w[redacted]d.  She reports she is doing well, states she having some back pain.  Still having some numbness/tingling in her RLE following epidural. No acute events overnight. She denies any problems with ambulating, voiding or po intake. Denies nausea or vomiting.  Pain is moderately controlled.  Lochia is same as period.  Objective: Blood pressure 117/72, pulse 80, temperature 97.7 F (36.5 C), temperature source Oral, resp. rate 17, height 5\' 5"  (1.651 m), weight 104.1 kg, last menstrual period 10/13/2022, SpO2 100%, unknown if currently breastfeeding.  Physical Exam:  General: alert, cooperative and no distress Chest: no respiratory distress Heart:regular rate, distal pulses intact Uterine Fundus: firm, appropriately tender DVT Evaluation: No calf swelling, asymmetric or tenderness. Negative Homan's. Strength/ROM 5/5 lower extremities. Extremities: no pedal edema Skin: warm, dry  Recent Labs    07/21/23 1507 07/23/23 0555  HGB 10.9* 8.9*  HCT 35.8* 28.7*    Assessment/Plan: Janice Nicholson is a 22 y.o. G1P1001 s/p SVD at [redacted]w[redacted]d. Her back pain is better w Ibuprofen/Tylenol. Likely 2/2 position during delivery. Counseled pt on pain following delivery and appropriate exercises. Encouraged ambulation. Discussed Right Leg Paraesthesia - could be a result of epidural which likely would resolve in approximately one week or position during labor/delivery.  No red flags on PE, encouraged careful ambulation.   PPD#1 - Doing well.  Routine postpartum care  Anemia: Hgb 9.9, Initiate Ferrous Sulfate 325 qMWF Back Pain: Tylenol/Ibuprofen. Right Leg Paraesthesia: CTM  Contraception: unsure Feeding: breast Dispo: Plan for discharge tomorrow..   LOS: 2 days   Paschal Dopp, MD 07/23/2023, 1:37 PM   Attestation of Supervision of Student:  I confirm that I have verified the information  documented in the  resident 's note and that I have also personally reperformed the history, physical exam and all medical decision making activities.  I have verified that all services and findings are accurately documented in this student's note; and I agree with management and plan as outlined in the documentation. I have also made any necessary editorial changes.   Sundra Aland, MD Center for [facility], [facility] 07/23/2023 4:23 PM

## 2023-07-23 NOTE — Anesthesia Postprocedure Evaluation (Signed)
Anesthesia Post Note  Patient: Janice Nicholson  Procedure(s) Performed: AN AD HOC LABOR EPIDURAL     Patient location during evaluation: Mother Baby Anesthesia Type: Epidural Level of consciousness: awake Pain management: satisfactory to patient Vital Signs Assessment: post-procedure vital signs reviewed and stable Respiratory status: spontaneous breathing Cardiovascular status: stable Anesthetic complications: no  No notable events documented.  Last Vitals:  Vitals:   07/22/23 2332 07/23/23 0509  BP: (!) 116/54 117/72  Pulse: 81 80  Resp: 16 17  Temp: 36.6 C 36.5 C  SpO2:      Last Pain:  Vitals:   07/23/23 0634  TempSrc:   PainSc: 2    Pain Goal:                   Janice Nicholson

## 2023-07-23 NOTE — Social Work (Signed)
CSW acknowledged consult and completed a clinical assessment.  There are no barriers to d/c.  Clinical assessment notes will be entered at a later time.  Kelsie Bowser, LCSWA Clinical Social Worker [phone]  

## 2023-07-23 NOTE — Clinical Social Work Maternal (Signed)
CLINICAL SOCIAL WORK MATERNAL/CHILD NOTE   Patient Details  Name: Janice Nicholson MRN: [id number] Date of Birth:    Date:  11/30/2020   Clinical Social Worker Initiating Note:  Janice Nicholson      Date/Time: Initiated:  07/23/23/1558      Child's Name:  Janice Nicholson 17-Sep-2021    Biological Parents:  Mother Janice Nicholson )    Need for Interpreter:  None    Reason for Referral:  Current Substance Use/Substance Use During Pregnancy      Address:  [address] [place] [facility] [place] [address]    Phone number:  [phone] (home)      Additional phone number:    Household Members/Support Persons (HM/SP):         HM/SP Name Relationship DOB or Age  HM/SP -1     HM/SP -2     HM/SP -3     HM/SP -4     HM/SP -5     HM/SP -6     HM/SP -7     HM/SP -8         Natural Supports (not living in the home):  Parent    Professional Supports: None    Employment: Full-time    Type of Work: [employer]    Education:  [employer] school graduate    Homebound arranged:     Surveyor, quantity Resources:  Medicaid    Other Resources:  Sales executive  , [facility]    Cultural/Religious Considerations Which May Impact Care:     Strengths:  Ability to meet basic needs  , Home prepared for child      Psychotropic Medications:          Pediatrician:        Pediatrician List:    Engineer, agricultural   [place]   [facility] [place]       Pediatrician Fax Number:     Risk Factors/Current Problems:  None    Cognitive State:  Alert  , Able to Concentrate      Mood/Affect:  Calm  , Comfortable  , Interested      CSW Assessment: CSW received a consult for THC use during pregnancy. CSW met with MOB to complete assessment and offer support. CSW entered the room and observed MOB in bed holding the infant.CSW introduced self, CSW role and reason for visit. MOB was agreeable to visit. CSW inquired about how MOB was feeling, MOB reported feeling tired. CSW  confirmed address and phone number with MOB MOB reported she will be discharging to [address] [place], [place] and the phone number on file was correct.    CSW inquired about MOB MH hx, MOB reported she has been diagnosed with depression, MOB reported she sees a therapist a family services monthly. CSW assessed for safety, MOB denied any SI, HI or DV. CSW provided education regarding the baby blues period vs. perinatal mood disorders, discussed treatment and gave resources for mental health follow up if concerns arise.  CSW recommends self-evaluation during the postpartum time period using the New Mom Checklist from Postpartum Progress and encouraged MOB to contact a medical professional if symptoms are noted at any time.  MOB identified her mom and FOB as her supports.    CSW inquired about MOB THC use, MOB reported she used THC in order to eat. CSW explained the hospital drug screen policy and informed MOB infant UDS was negative and CDS was pending, MOB  verbalized understanding.     CSW provided review of Sudden Infant Death Syndrome (SIDS) precautions.  MOB reported she has necessary items for the infant including a bassinet and car seat. MOB reported she could use more diapers. CSW offered to make a referral to [employer], MOB as agreeable.  CSW identifies no further need for intervention and no barriers to discharge at this time.   CSW Plan/Description:  No Further Intervention Required/No Barriers to Discharge, Sudden Infant Death Syndrome (SIDS) Education, Perinatal Mood and Anxiety Disorder (PMADs) Education, CSW Will Continue to Monitor Umbilical Cord Tissue Drug Screen Results and Make Report if Warranted, Other Information/Referral to [facility], [facility] Drug Screen Policy Information      Maud Deed, [place] 2023/08/27, 4:09 PM

## 2023-07-24 MED ORDER — FERROUS SULFATE 325 (65 FE) MG PO TBEC: 325.0000 mg | DELAYED_RELEASE_TABLET | ORAL | 3 refills | Status: DC

## 2023-07-24 MED ORDER — IBUPROFEN 600 MG PO TABS: 600.0000 mg | ORAL_TABLET | Freq: Four times a day (QID) | ORAL | 0 refills | Status: DC

## 2023-07-24 NOTE — Lactation Note (Signed)
This note was copied from a baby's chart. Lactation Consultation Note  Patient Name: Janice Nicholson [id number] Date: 07/24/2023 Age:20 hours  Reason for consult: Follow-up assessment;Primapara;1st time breastfeeding;Infant weight loss;Term  P1, [redacted]w[redacted]d, 7% weight loss  Mother and her nurse report that baby is breastfeeding well. Mother's breast are getting heavier and she denies any questions or concerns related to latching. Mother had recently breast fed. Mother just pumped and expressed 12 ml of colostrum and fed to baby by bottle.   Mother inquired about feeding baby both by breast and alternating with formula. Discussed the process of milk production, supply and demand and the importance of breast stimulation and milk removal. Instructed mother to breastfeed 8-12 times in 24 hours and skin to skin. Advised to establish her milk supply before introducing formula supplement. Pump when baby gets a bottle supplement to maintain her milk supply. Mother verbalized understanding. Has breast pump at home.   Mom made aware of O/P services, breastfeeding support groups, and our phone # for post-discharge questions.     Feeding Mother's Current Feeding Choice: Breast Milk  Interventions Interventions: Education  Discharge Discharge Education: Engorgement and breast care;Warning signs for feeding baby Pump: DEBP;Stork Pump (Medela)  Consult Status Consult Status: Complete Date: 07/24/23    Omar Person 07/24/2023, 1:09 PM

## 2023-07-25 ENCOUNTER — Ambulatory Visit (HOSPITAL_COMMUNITY): Payer: Self-pay

## 2023-07-25 NOTE — Lactation Note (Signed)
This note was copied from a baby's chart. Lactation Consultation Note  Patient Name: Caren Hazy [id number] Date: 07/25/2023 Age:20 hours Reason for consult: Follow-up assessment;1st time breastfeeding  Mother of 3 day old brought baby to the ED because mother states she was trying to breastfeed baby but was worried about her breastmilk production.  She described the baby wanting to feed frequently last night.   At 1400 baby recevied 60 ml of 20 kcal of Enfamil formula with slow flow nipple prior to [facility] visit.    She attempted using her new pump at home and a hand pump with no success expressing as much volume as in the hospital.  She was using the Medela Symphony in the hospital with 24 mm flanges and pumped 20 ml on day 2.  Demonstrated how to use her new Medela pump, fitted mother with 21 mm flanges and mother pumped 40 ml in the ED.   Mother states she wants to breastfeed and formula feed.  Gave written instruction on feeding volumes, milk storage guidelines, feeding recording sheets and formula preparation.  Reviewed engorgement care and monitoring voids/stools. Phone number given to lactation dept for further questions.   Plan: Feed baby when she cues that she is hungry, or awaken baby for feeding at 3 hrs.  Pump both breasts 15-20 minutes every 2.5 to 3 hours.  Refer to volume guideline sheets for volume per day of life.  First give pumped breastmilk and the difference with formula.  Adjust volume guidelines increasing per day of life and as baby desires. Mother has the option of returning to breastfeeding which would be 8-12 times per day.    Maternal Data Does the patient have breastfeeding experience prior to this delivery?: No  Feeding Mother's Current Feeding Choice: Breast Milk and Formula Nipple Type: Slow - flow  Lactation Tools Discussed/Used Tools: Pump;Flanges Flange Size: 21 Breast pump type: Double-Electric Breast Pump;Manual Pump Education: Setup,  frequency, and cleaning;Milk Storage Reason for Pumping: feeding Pumping frequency: q 3 hours for 15 min Pumped volume: 40 mL  Interventions Interventions: DEBP;Education;CDC Guidelines for Breast Pump Cleaning  Discharge Discharge Education: Warning signs for feeding baby;Engorgement and breast care Pump: Stork Pump [employer])  Consult Status Consult Status: Complete    Hardie Pulley  RN IBCLC 07/25/2023, 2:43 PM

## 2023-07-28 ENCOUNTER — Inpatient Hospital Stay (HOSPITAL_COMMUNITY): Admission: RE | Admit: 2023-07-28 | Payer: Medicaid Other | Source: Home / Self Care | Admitting: Family Medicine

## 2023-07-28 ENCOUNTER — Inpatient Hospital Stay (HOSPITAL_COMMUNITY): Payer: Medicaid Other

## 2023-08-24 ENCOUNTER — Telehealth (HOSPITAL_COMMUNITY): Payer: Self-pay | Admitting: *Deleted

## 2023-08-24 NOTE — Telephone Encounter (Signed)
08/24/2023  Name: Morna Flud MRN: [id number] DOB: [birth year]  Reason for Call:  Transition of Care Hospital Discharge Call  Contact Status: Patient Contact Status: Complete  Language assistant needed: Interpreter Mode: Interpreter Not Needed        Follow-Up Questions: Do You Have Any Concerns About Your Health As You Heal From Delivery?: No Do You Have Any Concerns About Your Infants Health?: No  Edinburgh Postnatal Depression Scale:  In the Past 7 Days: I have been able to laugh and see the funny side of things.: As much as I always could I have looked forward with enjoyment to things.: As much as I ever did I have blamed myself unnecessarily when things went wrong.: Not very often I have been anxious or worried for no good reason.: Hardly ever I have felt scared or panicky for no good reason.: No, not at all Things have been getting on top of me.: No, most of the time I have coped quite well I have been so unhappy that I have had difficulty sleeping.: Not at all I have felt sad or miserable.: Not very often I have been so unhappy that I have been crying.: Only occasionally The thought of harming myself has occurred to me.: Never Edinburgh Postnatal Depression Scale Total: 5  PHQ2-9 Depression Scale:     Discharge Follow-up: Edinburgh score requires follow up?: No Patient was advised of the following resources:: Breastfeeding Support Group, Support Group  Post-discharge interventions: Reviewed Newborn Safe Sleep Practices  Salena Saner, RN 08/24/2023 15:22

## 2023-08-25 NOTE — Progress Notes (Unsigned)
    Post Partum Visit Note  Janice Nicholson is a 22 y.o. G67P1001 female who presents for a postpartum visit. She is 7 weeks postpartum following a normal spontaneous vaginal delivery.  I have fully reviewed the prenatal and intrapartum course. The delivery was at 40 gestational weeks.  Anesthesia: epidural. Postpartum course has been ***. Baby is doing well***. Baby is feeding by {breastmilk/bottle:69}. Bleeding {vag bleed:12292}. Bowel function is {normal:32111}. Bladder function is {normal:32111}. Patient {is/is not:9024} sexually active. Contraception method is {contraceptive method:5051}. Postpartum depression screening: {gen negative/positive:315881}.   The pregnancy intention screening data noted above was reviewed. Potential methods of contraception were discussed. The patient elected to proceed with No data recorded.    Health Maintenance Due  Topic Date Due   COVID-19 Vaccine (1) Never done   HPV VACCINES (1 - 3-dose series) Never done   DTaP/Tdap/Td (1 - Tdap) Never done   INFLUENZA VACCINE  06/25/2023    {Common ambulatory SmartLinks:19316}  Review of Systems {ros; complete:30496}  Objective:  LMP 10/13/2022    General:  {gen appearance:16600}   Breasts:  {desc; normal/abnormal/not indicated:14647}  Lungs: {lung exam:16931}  Heart:  {heart exam:5510}  Abdomen: {abdomen exam:16834}   Wound {Wound assessment:11097}  GU exam:  {desc; normal/abnormal/not indicated:14647}       Assessment:    There are no diagnoses linked to this encounter.  *** postpartum exam.   Plan:   Essential components of care per ACOG recommendations:  1.  Mood and well being: Patient with {gen negative/positive:315881} depression screening today. Reviewed local resources for support.  - Patient tobacco use? {tobacco use:25506}  - hx of drug use? {yes/no:25505}    2. Infant care and feeding:  -Patient currently breastmilk feeding? {yes/no:25502}  -Social determinants of health (SDOH)  reviewed in EPIC. No concerns***The following needs were identified***  3. Sexuality, contraception and birth spacing - Patient {DOES_DOES AOZ:30865} want a pregnancy in the next year.  Desired family size is {NUMBER 1-10:22536} children.  - Reviewed reproductive life planning. Reviewed contraceptive methods based on pt preferences and effectiveness.  Patient desired {Upstream End Methods:24109} today.   - Discussed birth spacing of 18 months  4. Sleep and fatigue -Encouraged family/partner/community support of 4 hrs of uninterrupted sleep to help with mood and fatigue  5. Physical Recovery  - Discussed patients delivery and complications. She describes her labor as {description:25511} - Patient had a {CHL AMB DELIVERY:[phone]}. Patient had a {laceration:25518} laceration. Perineal healing reviewed. Patient expressed understanding - Patient has urinary incontinence? {yes/no:25515} - Patient {ACTION; IS/IS HQI:[id number]} safe to resume physical and sexual activity  6.  Health Maintenance - HM due items addressed {Yes or If no, why not?:20788} - Last pap smear No results found for: "DIAGPAP" Pap smear {done:10129} at today's visit.  -Breast Cancer screening indicated? {indicated:25516}  7. Chronic Disease/Pregnancy Condition follow up: {Follow up:25499}  - PCP follow up  Coolidge Breeze, RMA Center for [facility], [facility]

## 2023-08-26 ENCOUNTER — Other Ambulatory Visit: Payer: Self-pay

## 2023-08-26 ENCOUNTER — Ambulatory Visit: Payer: Medicaid Other | Admitting: Family Medicine

## 2023-08-26 ENCOUNTER — Encounter: Payer: Self-pay | Admitting: Family Medicine

## 2023-08-26 VITALS — BP 123/68 | HR 76 | Wt 222.0 lb

## 2023-08-26 DIAGNOSIS — O9081 Anemia of the puerperium: Secondary | ICD-10-CM

## 2023-08-26 DIAGNOSIS — J4540 Moderate persistent asthma, uncomplicated: Secondary | ICD-10-CM

## 2023-08-26 LAB — POCT HEMOGLOBIN-HEMACUE: Hemoglobin: 10.4 g/dL — ABNORMAL LOW (ref 12.0–15.0)

## 2023-08-26 MED ORDER — METOCLOPRAMIDE HCL 10 MG PO TABS: 10.0000 mg | ORAL_TABLET | Freq: Three times a day (TID) | ORAL | 0 refills | Status: DC

## 2023-08-26 MED ORDER — VENTOLIN HFA 108 (90 BASE) MCG/ACT IN AERS: 1.0000 | INHALATION_SPRAY | RESPIRATORY_TRACT | 2 refills | Status: DC | PRN

## 2023-08-26 NOTE — Progress Notes (Signed)
Met with mom while in the office. She is concerned with milk supply. Infant is BF, pumping and formula feeding.   Infant was BF and mom was pumping and bottle feeding the the early days until mom returned to work.   Infant feeds at the breast 3 times a day and mom pumps about 4 times a day. She can get 2 ounces per pumping with Medela PIS and gets less than an ounce per breast with each pumping with other pumps. Reviewed supply and demand.   Mom has a Mom Cozy S12 (# 21 flanges,seems to large) and Elvie Stride (# 24 flanges) and Medela PIS. She report she gets the most from Medela PIS ( #21 flange). Nipples are swollen post pumping and areola is reddened at the base of the nipple and mom says areola pulling up beside nipple with pumping. She tried the #19 flanges in the office and reports more comfort. Reviewed may have to pump for up to 30 minutes with wearable pump.   Mom has measured nipples and notes that she needs is a #17-18 mm. Tried the Mom Cozy S12 with # 19 flanges with much more comfort. He was able to express a little more milk.   Mom works about 7 hours a day. She does get pumping breaks. She reports she worries about her milk supply. Reviewed relaxing with pumping as much as possible.   Mom reports she drinks a lot of water. She feels like she is eating enough. She has tried Insurance account manager, [employer]. She has tried boiled cumin seed tea with some success. She has tried Milk Flow Plus drinks, [employer] Milk Pump Deaver, Laqueta Jean mom postpartum Lactation Gummies. She was on Reglan with pregnancy with no side effects. She has tried power pumping but finds it hard to stick with it.   Reviewed nursing anytime infant is with her. Reviewed pumping at least 8 times a day. Encouraged to double pump if able. Reviewed keeping suction low until healed.   Reviewed Reglan with patient. Reviewed side effects of sleepiness, depression and Tardive Dyskinesia. Reviewed to call if any side  effects are noted.

## 2023-09-07 ENCOUNTER — Ambulatory Visit: Payer: Medicaid Other | Admitting: Obstetrics and Gynecology

## 2023-09-17 NOTE — Plan of Care (Signed)
CHL Tonsillectomy/Adenoidectomy, Postoperative PEDS care plan entered in error.

## 2023-10-19 ENCOUNTER — Other Ambulatory Visit: Payer: Self-pay

## 2023-10-19 ENCOUNTER — Encounter: Payer: Self-pay | Admitting: Obstetrics & Gynecology

## 2023-10-19 ENCOUNTER — Ambulatory Visit: Payer: Medicaid Other | Admitting: Obstetrics & Gynecology

## 2023-10-19 VITALS — BP 135/83 | HR 84 | Wt 229.8 lb

## 2023-10-19 DIAGNOSIS — Z3202 Encounter for pregnancy test, result negative: Secondary | ICD-10-CM

## 2023-10-19 DIAGNOSIS — O9081 Anemia of the puerperium: Secondary | ICD-10-CM

## 2023-10-19 LAB — POCT PREGNANCY, URINE: Preg Test, Ur: NEGATIVE

## 2023-11-02 ENCOUNTER — Encounter: Payer: Self-pay | Admitting: Obstetrics & Gynecology

## 2023-11-02 ENCOUNTER — Ambulatory Visit: Payer: Medicaid Other | Admitting: Obstetrics & Gynecology

## 2023-11-02 ENCOUNTER — Other Ambulatory Visit: Payer: Self-pay

## 2023-11-02 VITALS — BP 120/81 | HR 92 | Wt 238.4 lb

## 2023-11-02 DIAGNOSIS — Z30017 Encounter for initial prescription of implantable subdermal contraceptive: Secondary | ICD-10-CM | POA: Diagnosis not present

## 2023-11-02 DIAGNOSIS — Z1331 Encounter for screening for depression: Secondary | ICD-10-CM | POA: Diagnosis not present

## 2023-11-02 LAB — POCT PREGNANCY, URINE: Preg Test, Ur: NEGATIVE

## 2023-11-02 MED ORDER — IBUPROFEN 800 MG PO TABS
800.0000 mg | ORAL_TABLET | Freq: Once | ORAL | Status: AC
Start: 2023-11-02 — End: 2023-11-02
  Administered 2023-11-02: 800 mg via ORAL

## 2023-11-02 MED ORDER — ETONOGESTREL 68 MG ~~LOC~~ IMPL
68.0000 mg | DRUG_IMPLANT | Freq: Once | SUBCUTANEOUS | Status: AC
Start: 2023-11-02 — End: 2023-11-02
  Administered 2023-11-02: 68 mg via SUBCUTANEOUS

## 2023-11-02 NOTE — Progress Notes (Signed)
GYNECOLOGY OFFICE PROCEDURE NOTE  Janice Nicholson is a 22 y.o. G1P1001 here for Nexplanon insertion.   No other gynecologic concerns. No intercourse for last 2 weeks  Nexplanon Insertion Procedure Patient identified, informed consent performed, consent signed.   Patient does understand that irregular bleeding is a very common side effect of this medication. She was advised to have backup contraception for one week after placement. Pregnancy test in clinic today was negative.  Appropriate time out taken.  Patient's left arm was prepped and draped in the usual sterile fashion. The ruler used to measure and mark insertion area.  Patient was prepped with alcohol swab and then injected with 3 ml of 1% lidocaine.  She was prepped with betadine, Nexplanon removed from packaging,  Device confirmed in needle, then inserted full length of needle and withdrawn per handbook instructions. Nexplanon was able to palpated in the patient's arm; patient palpated the insert herself. There was minimal blood loss.  Patient insertion site covered with gauze and a pressure bandage to reduce any bruising.  The patient tolerated the procedure well and was given post procedure instructions.   Adam Phenix, MD Attending Obstetrician & Gynecologist, [facility]  11/02/2023

## 2023-11-02 NOTE — Addendum Note (Signed)
Addended by: Brien Mates T on: 11/02/2023 04:53 PM   Modules accepted: Orders

## 2023-11-03 ENCOUNTER — Other Ambulatory Visit: Payer: Self-pay | Admitting: Family Medicine

## 2023-11-11 NOTE — Progress Notes (Signed)
Patient rescheduled Nexplanon for 2 weeks and will abstain in the interval

## 2024-01-12 ENCOUNTER — Encounter (HOSPITAL_COMMUNITY): Payer: Self-pay

## 2024-01-12 ENCOUNTER — Emergency Department (HOSPITAL_COMMUNITY)
Admission: EM | Admit: 2024-01-12 | Discharge: 2024-01-12 | Disposition: A | Discharge status: Home / Self Care | Payer: Medicaid Other | Attending: Emergency Medicine | Admitting: Emergency Medicine

## 2024-01-12 ENCOUNTER — Emergency Department (HOSPITAL_COMMUNITY): Payer: Medicaid Other

## 2024-01-12 ENCOUNTER — Other Ambulatory Visit: Payer: Self-pay

## 2024-01-12 DIAGNOSIS — R1084 Generalized abdominal pain: Secondary | ICD-10-CM | POA: Diagnosis not present

## 2024-01-12 DIAGNOSIS — R197 Diarrhea, unspecified: Secondary | ICD-10-CM | POA: Insufficient documentation

## 2024-01-12 DIAGNOSIS — R112 Nausea with vomiting, unspecified: Secondary | ICD-10-CM | POA: Insufficient documentation

## 2024-01-12 LAB — URINALYSIS, W/ REFLEX TO CULTURE (INFECTION SUSPECTED)
Bilirubin Urine: NEGATIVE
Glucose, UA: NEGATIVE mg/dL
Ketones, ur: NEGATIVE mg/dL
Leukocytes,Ua: NEGATIVE
Nitrite: NEGATIVE
Protein, ur: 30 mg/dL — AB
Specific Gravity, Urine: 1.028 (ref 1.005–1.030)
pH: 5 (ref 5.0–8.0)

## 2024-01-12 LAB — CBC WITH DIFFERENTIAL/PLATELET
Abs Immature Granulocytes: 0.1 10*3/uL — ABNORMAL HIGH (ref 0.00–0.07)
Basophils Absolute: 0 10*3/uL (ref 0.0–0.1)
Basophils Relative: 0 %
Eosinophils Absolute: 0 10*3/uL (ref 0.0–0.5)
Eosinophils Relative: 0 %
HCT: 42.5 % (ref 36.0–46.0)
Hemoglobin: 12.9 g/dL (ref 12.0–15.0)
Immature Granulocytes: 1 %
Lymphocytes Relative: 4 %
Lymphs Abs: 0.8 10*3/uL (ref 0.7–4.0)
MCH: 22.5 pg — ABNORMAL LOW (ref 26.0–34.0)
MCHC: 30.4 g/dL (ref 30.0–36.0)
MCV: 74.2 fL — ABNORMAL LOW (ref 80.0–100.0)
Monocytes Absolute: 0.8 10*3/uL (ref 0.1–1.0)
Monocytes Relative: 4 %
Neutro Abs: 16.9 10*3/uL — ABNORMAL HIGH (ref 1.7–7.7)
Neutrophils Relative %: 91 %
Platelets: 514 10*3/uL — ABNORMAL HIGH (ref 150–400)
RBC: 5.73 MIL/uL — ABNORMAL HIGH (ref 3.87–5.11)
RDW: 15.1 % (ref 11.5–15.5)
WBC: 18.6 10*3/uL — ABNORMAL HIGH (ref 4.0–10.5)
nRBC: 0 % (ref 0.0–0.2)

## 2024-01-12 LAB — COMPREHENSIVE METABOLIC PANEL
ALT: 24 U/L (ref 0–44)
AST: 31 U/L (ref 15–41)
Albumin: 4.1 g/dL (ref 3.5–5.0)
Alkaline Phosphatase: 79 U/L (ref 38–126)
Anion gap: 13 (ref 5–15)
BUN: 12 mg/dL (ref 6–20)
CO2: 22 mmol/L (ref 22–32)
Calcium: 9.5 mg/dL (ref 8.9–10.3)
Chloride: 104 mmol/L (ref 98–111)
Creatinine, Ser: 0.68 mg/dL (ref 0.44–1.00)
GFR, Estimated: >60 mL/min (ref >60)
Glucose, Bld: 128 mg/dL — ABNORMAL HIGH (ref 70–99)
Potassium: 4.2 mmol/L (ref 3.5–5.1)
Sodium: 139 mmol/L (ref 135–145)
Total Bilirubin: 0.6 mg/dL (ref 0.0–1.2)
Total Protein: 8.2 g/dL — ABNORMAL HIGH (ref 6.5–8.1)

## 2024-01-12 LAB — LIPASE, BLOOD: Lipase: 25 U/L (ref 11–51)

## 2024-01-12 LAB — HCG, SERUM, QUALITATIVE: Preg, Serum: NEGATIVE

## 2024-01-12 MED ORDER — ONDANSETRON HCL 4 MG PO TABS: 4.0000 mg | ORAL_TABLET | Freq: Four times a day (QID) | ORAL | 0 refills | Status: DC

## 2024-01-12 MED ORDER — SODIUM CHLORIDE 0.9 % IV BOLUS
1000.0000 mL | Freq: Once | INTRAVENOUS | Status: AC
  Administered 2024-01-12: 1000 mL via INTRAVENOUS

## 2024-01-12 MED ORDER — IOHEXOL 350 MG/ML SOLN
75.0000 mL | Freq: Once | INTRAVENOUS | Status: AC | PRN
  Administered 2024-01-12: 75 mL via INTRAVENOUS

## 2024-01-12 MED ORDER — KETOROLAC TROMETHAMINE 15 MG/ML IJ SOLN
15.0000 mg | Freq: Once | INTRAMUSCULAR | Status: AC
  Administered 2024-01-12: 15 mg via INTRAVENOUS
  Filled 2024-01-12: qty 1

## 2024-01-12 MED ORDER — ONDANSETRON HCL 4 MG/2ML IJ SOLN
4.0000 mg | Freq: Once | INTRAMUSCULAR | Status: AC
  Administered 2024-01-12: 4 mg via INTRAVENOUS
  Filled 2024-01-12: qty 2

## 2024-01-12 MED ORDER — PROMETHAZINE (PHENERGAN) 6.25MG IN NS 50ML IVPB
6.2500 mg | Freq: Four times a day (QID) | INTRAVENOUS | Status: DC | PRN
  Administered 2024-01-12: 6.25 mg via INTRAVENOUS
  Filled 2024-01-12: qty 6.25

## 2024-01-12 MED ORDER — PROMETHAZINE HCL 25 MG RE SUPP: 25.0000 mg | Freq: Four times a day (QID) | RECTAL | 0 refills | Status: DC | PRN

## 2024-01-12 NOTE — Discharge Instructions (Addendum)
 Return for any problem.  ?

## 2024-01-12 NOTE — ED Triage Notes (Signed)
 Pt to ED via [facility] from home. Pt has had nausea, vomiting, diarrhea, fever, and abdominal pain that started yesterday. Pt is 5mon post partum, did not have any complications w/pregnancy or delivery.

## 2024-01-12 NOTE — ED Provider Notes (Signed)
 [place] EMERGENCY DEPARTMENT AT [facility] Provider Note   CSN: [id number] Arrival date & time: 01/12/24  0825     History {Add pertinent medical, surgical, social history, OB history to HPI:1} Chief Complaint  Patient presents with   Abdominal Pain    Janice Nicholson is a 23 y.o. female.  23 year old female with prior medical history as detailed below presents with complaint of nausea, vomiting, diarrhea.  Symptoms began last night at 8 pm.   She took reglan at home, but then threw up that.  No fever.   Complains of diffuse abdominal cramping  The history is provided by the patient and medical records.       Home Medications Prior to Admission medications   Medication Sig Start Date End Date Taking? Authorizing Provider  albuterol (VENTOLIN HFA) 108 (90 Base) MCG/ACT inhaler Inhale 1-2 puffs into the lungs every 4 (four) hours as needed for wheezing or shortness of breath. 08/26/23   Celedonio Savage, MD  ibuprofen (ADVIL) 600 MG tablet Take 1 tablet (600 mg total) by mouth every 6 (six) hours. 07/24/23   Wouk, Wilfred Curtis, MD  Prenatal Vit-Fe Fumarate-FA (PRENATAL VITAMIN PO) Take by mouth. Patient not taking: Reported on 08/26/2023    [provider]      Allergies    Lactose intolerance (gi), Egg-derived products, Molds & smuts, and Penicillins    Review of Systems   Review of Systems  All other systems reviewed and are negative.   Physical Exam Updated Vital Signs Ht 5\' 5"  (1.651 m)   Wt 105 kg   BMI 38.52 kg/m  Physical Exam Vitals and nursing note reviewed.  Constitutional:      General: She is not in acute distress.    Appearance: Normal appearance. She is well-developed. She is obese.  HENT:     Head: Normocephalic and atraumatic.  Eyes:     Conjunctiva/sclera: Conjunctivae normal.     Pupils: Pupils are equal, round, and reactive to light.  Cardiovascular:     Rate and Rhythm: Normal rate and regular rhythm.     Heart  sounds: Normal heart sounds. No murmur heard. Pulmonary:     Effort: Pulmonary effort is normal. No respiratory distress.     Breath sounds: Normal breath sounds.  Abdominal:     General: Abdomen is flat. There is no distension.     Palpations: Abdomen is soft.     Tenderness: There is generalized abdominal tenderness.  Musculoskeletal:        General: No swelling or deformity. Normal range of motion.     Cervical back: Normal range of motion and neck supple.  Skin:    General: Skin is warm and dry.  Neurological:     General: No focal deficit present.     Mental Status: She is alert and oriented to person, place, and time.     ED Results / Procedures / Treatments   Labs (all labs ordered are listed, but only abnormal results are displayed) Labs Reviewed - No data to display  EKG None  Radiology No results found.  Procedures Procedures  {Document cardiac monitor, telemetry assessment procedure when appropriate:1}  Medications Ordered in ED Medications  sodium chloride 0.9 % bolus 1,000 mL (has no administration in time range)  ondansetron (ZOFRAN) injection 4 mg (has no administration in time range)    ED Course/ Medical Decision Making/ A&P   {   Click here for ABCD2, HEART and other  calculatorsREFRESH Note before signing :1}                              Medical Decision Making Amount and/or Complexity of Data Reviewed Labs: ordered.  Risk Prescription drug management.    Medical Screen Complete  This patient presented to the ED with complaint of n/v/d.  This complaint involves an extensive number of treatment options. The initial differential diagnosis includes, but is not limited to, viral vs bacterial infection, aki, dehydration, metabolic abnormality, etc  This presentation is: Acute, Self-Limited, Previously Undiagnosed, Uncertain Prognosis, Complicated, and Systemic Symptoms  Patient with n/v/d. Symptoms times 12 hours.     Co morbidities that  complicated the patient's evaluation  See HPI   Additional history obtained: External records from outside sources obtained and reviewed including prior ED visits and prior Inpatient records.    Lab Tests:  I ordered and personally interpreted labs.  The pertinent results include:  CBC CMP Lipase hcg ua   Imaging Studies ordered:  I ordered imaging studies including ***  I independently visualized and interpreted obtained imaging which showed *** I agree with the radiologist interpretation.   Cardiac Monitoring:  The patient was maintained on a cardiac monitor.  I personally viewed and interpreted the cardiac monitor which showed an underlying rhythm of: ***   Medicines ordered:  I ordered medication including ***  for ***  Reevaluation of the patient after these medicines showed that the patient: {resolved/improved/worsened:23923::"improved"}    Test Considered:  ***   Critical Interventions:  ***   Consultations Obtained:  I consulted ***,  and discussed lab and imaging findings as well as pertinent plan of care.    Problem List / ED Course:  ***   Reevaluation:  After the interventions noted above, I reevaluated the patient and found that they have: {resolved/improved/worsened:23923::"improved"}   Social Determinants of Health:  ***   Disposition:  After consideration of the diagnostic results and the patients response to treatment, I feel that the patent would benefit from ***.    {Document critical care time when appropriate:1} {Document review of labs and clinical decision tools ie heart score, Chads2Vasc2 etc:1}  {Document your independent review of radiology images, and any outside records:1} {Document your discussion with family members, caretakers, and with consultants:1} {Document social determinants of health affecting pt's care:1} {Document your decision making why or why not admission, treatments were needed:1} Final Clinical  Impression(s) / ED Diagnoses Final diagnoses:  None    Rx / DC Orders ED Discharge Orders     None

## 2024-01-30 ENCOUNTER — Other Ambulatory Visit (INDEPENDENT_AMBULATORY_CARE_PROVIDER_SITE_OTHER): Payer: Self-pay | Admitting: Nurse Practitioner

## 2024-01-30 ENCOUNTER — Telehealth: Admitting: Nurse Practitioner

## 2024-01-30 DIAGNOSIS — K047 Periapical abscess without sinus: Secondary | ICD-10-CM

## 2024-01-30 MED ORDER — IBUPROFEN 600 MG PO TABS: 600.0000 mg | ORAL_TABLET | Freq: Four times a day (QID) | ORAL | 0 refills | Status: AC

## 2024-01-30 MED ORDER — CLINDAMYCIN HCL 300 MG PO CAPS: 300.0000 mg | ORAL_CAPSULE | Freq: Three times a day (TID) | ORAL | 0 refills | Status: AC

## 2024-01-30 NOTE — Progress Notes (Signed)
 I have spent 5 minutes in review of e-visit questionnaire, review and updating patient chart, medical decision making and response to patient.   Claiborne Rigg, NP

## 2024-01-30 NOTE — Progress Notes (Signed)

## 2024-02-18 ENCOUNTER — Encounter: Payer: Self-pay | Admitting: Podiatry

## 2024-02-18 ENCOUNTER — Ambulatory Visit (INDEPENDENT_AMBULATORY_CARE_PROVIDER_SITE_OTHER): Admitting: Podiatry

## 2024-02-18 DIAGNOSIS — L600 Ingrowing nail: Secondary | ICD-10-CM | POA: Diagnosis not present

## 2024-02-18 NOTE — Patient Instructions (Signed)

## 2024-02-18 NOTE — Progress Notes (Signed)
 Subjective:   Patient ID: Janice Nicholson, female   DOB: 23 y.o.   MRN: [id number]   HPI Patient states the 2 big toenails are very ingrown and painful she has tried to soak herself she has tried to trim on them.  States she has had some redness and drainage is local and patient does have a baby currently patient does not smoke and likes to be active   Review of Systems  All other systems reviewed and are negative.       Objective:  Physical Exam Vitals and nursing note reviewed.  Constitutional:      Appearance: She is well-developed.  Pulmonary:     Effort: Pulmonary effort is normal.  Musculoskeletal:        General: Normal range of motion.  Skin:    General: Skin is warm.  Neurological:     Mental Status: She is alert.     Neurovascular status intact muscle strength adequate range of motion adequate with significant incurvation of the medial borders of the hallux bilateral moderate on the lateral side but not to the same degree.  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  Today I went ahead I infiltrated each big toe 60 mg like Marcaine mixture sterile prep and each toe and using sterile send tissue removed the medial border exposed matrix applied phenol 3 applications 30 seconds followed by alcohol by sterile dressing and gave instructions on soaks and wear dressing 24 hours call if any issues were to occur

## 2024-02-19 ENCOUNTER — Telehealth: Payer: Self-pay | Admitting: Podiatry

## 2024-02-19 ENCOUNTER — Telehealth

## 2024-02-19 ENCOUNTER — Encounter: Payer: Self-pay | Admitting: Podiatry

## 2024-02-19 NOTE — Telephone Encounter (Signed)
 Pt called and requested a letter to be excused from work. She was seen yesterday by Dr. Charlsie Merles for ingrown toenail and is still in a great deal of pain. Letter was uploaded to MyChart.  She also, want to speak with doctor/nurse: She thinks the bandage was wrapped to tight and states she could not feel her toe. She removed bandage and the toe is blue w/ no feeling and very painful. Taking Tylenol but it is not helping relieve the pain. Please call to advise. Thank you.

## 2024-02-23 ENCOUNTER — Other Ambulatory Visit: Payer: Self-pay | Admitting: Podiatry

## 2024-02-23 MED ORDER — TRAMADOL HCL 50 MG PO TABS: 50.0000 mg | ORAL_TABLET | Freq: Three times a day (TID) | ORAL | 0 refills | Status: AC | PRN

## 2024-03-10 ENCOUNTER — Other Ambulatory Visit: Payer: Self-pay | Admitting: Family Medicine

## 2024-04-12 ENCOUNTER — Telehealth: Admitting: Physician Assistant

## 2024-04-12 DIAGNOSIS — A084 Viral intestinal infection, unspecified: Secondary | ICD-10-CM | POA: Diagnosis not present

## 2024-04-12 MED ORDER — ONDANSETRON 4 MG PO TBDP: 4.0000 mg | ORAL_TABLET | Freq: Three times a day (TID) | ORAL | 0 refills | Status: DC | PRN

## 2024-04-12 NOTE — Progress Notes (Signed)
 I have spent 5 minutes in review of e-visit questionnaire, review and updating patient chart, medical decision making and response to patient.   Piedad Climes, PA-C

## 2024-04-12 NOTE — Progress Notes (Signed)
 E-Visit for Diarrhea  We are sorry that you are not feeling well.  Here is how we plan to help!  Based on what you have shared with me it looks like you have Acute Infectious Diarrhea.  Most cases of acute diarrhea are due to infections with virus and bacteria and are self-limited conditions lasting less than 14 days.  For your symptoms you may take Imodium 2 mg tablets that are over the counter at your local pharmacy. Take two tablet now and then one after each loose stool up to 6 a day.  Antibiotics are not needed for most people with diarrhea.  I have prescribed  Zofran 4 mg 1 tablet every 8 hours as needed for nausea and vomiting  HOME CARE We recommend changing your diet to help with your symptoms for the next few days. Drink plenty of fluids that contain water salt and sugar. Sports drinks such as Gatorade may help.  You may try broths, soups, bananas, applesauce, soft breads, mashed potatoes or crackers.  You are considered infectious for as long as the diarrhea continues. Hand washing or use of alcohol based hand sanitizers is recommend. It is best to stay out of work or school until your symptoms stop.   GET HELP RIGHT AWAY If you have dark yellow colored urine or do not pass urine frequently you should drink more fluids.   If your symptoms worsen  If you feel like you are going to pass out (faint) You have a new problem  MAKE SURE YOU  Understand these instructions. Will watch your condition. Will get help right away if you are not doing well or get worse.  Thank you for choosing an e-visit.  Your e-visit answers were reviewed by a board certified advanced clinical practitioner to complete your personal care plan. Depending upon the condition, your plan could have included both over the counter or prescription medications.  Please review your pharmacy choice. Make sure the pharmacy is open so you can pick up prescription now. If there is a problem, you may contact your  provider through [employer] and have the prescription routed to another pharmacy.  Your safety is important to [place]. If you have drug allergies check your prescription carefully.   For the next 24 hours you can use MyChart to ask questions about today's visit, request a non-urgent call back, or ask for a work or school excuse. You will get an email in the next two days asking about your experience. I hope that your e-visit has been valuable and will speed your recovery.

## 2024-04-20 ENCOUNTER — Ambulatory Visit (HOSPITAL_COMMUNITY)

## 2024-04-20 ENCOUNTER — Encounter (HOSPITAL_COMMUNITY): Payer: Self-pay

## 2024-04-20 ENCOUNTER — Ambulatory Visit (HOSPITAL_COMMUNITY)
Admission: EM | Admit: 2024-04-20 | Discharge: 2024-04-20 | Disposition: A | Discharge status: Home / Self Care | Attending: Internal Medicine | Admitting: Internal Medicine

## 2024-04-20 DIAGNOSIS — R1114 Bilious vomiting: Secondary | ICD-10-CM | POA: Diagnosis not present

## 2024-04-20 DIAGNOSIS — R5383 Other fatigue: Secondary | ICD-10-CM | POA: Diagnosis not present

## 2024-04-20 DIAGNOSIS — E860 Dehydration: Secondary | ICD-10-CM

## 2024-04-20 DIAGNOSIS — R0602 Shortness of breath: Secondary | ICD-10-CM | POA: Diagnosis not present

## 2024-04-20 DIAGNOSIS — K529 Noninfective gastroenteritis and colitis, unspecified: Secondary | ICD-10-CM

## 2024-04-20 DIAGNOSIS — G4489 Other headache syndrome: Secondary | ICD-10-CM

## 2024-04-20 LAB — POCT URINALYSIS DIP (MANUAL ENTRY)
Glucose, UA: 100 mg/dL — AB
Ketones, POC UA: NEGATIVE mg/dL
Leukocytes, UA: NEGATIVE
Nitrite, UA: NEGATIVE
Protein Ur, POC: 30 mg/dL — AB
Spec Grav, UA: >=1.03 — AB (ref 1.010–1.025)
Urobilinogen, UA: 0.2 U/dL
pH, UA: 6 (ref 5.0–8.0)

## 2024-04-20 LAB — POCT URINE PREGNANCY: Preg Test, Ur: NEGATIVE

## 2024-04-20 MED ORDER — ONDANSETRON 4 MG PO TBDP
ORAL_TABLET | ORAL | Status: AC
  Filled 2024-04-20: qty 1

## 2024-04-20 MED ORDER — ONDANSETRON 4 MG PO TBDP
4.0000 mg | ORAL_TABLET | Freq: Three times a day (TID) | ORAL | 0 refills | Status: DC | PRN
Start: 2024-04-20 — End: 2024-06-23

## 2024-04-20 MED ORDER — SODIUM CHLORIDE 0.9 % IV BOLUS
1000.0000 mL | Freq: Once | INTRAVENOUS | Status: AC
  Administered 2024-04-20: 1000 mL via INTRAVENOUS

## 2024-04-20 MED ORDER — KETOROLAC TROMETHAMINE 30 MG/ML IJ SOLN
INTRAMUSCULAR | Status: AC
  Filled 2024-04-20: qty 1

## 2024-04-20 MED ORDER — ONDANSETRON 4 MG PO TBDP
4.0000 mg | ORAL_TABLET | Freq: Once | ORAL | Status: AC
  Administered 2024-04-20: 4 mg via ORAL

## 2024-04-20 MED ORDER — KETOROLAC TROMETHAMINE 30 MG/ML IJ SOLN
30.0000 mg | Freq: Once | INTRAMUSCULAR | Status: AC
  Administered 2024-04-20: 30 mg via INTRAMUSCULAR

## 2024-04-20 NOTE — ED Provider Notes (Signed)
 [facility]    CSN: [id number] Arrival date & time: 04/20/24  1451      History   Chief Complaint Chief Complaint  Patient presents with   Emesis    HPI Janice Nicholson is a 23 y.o. female.   23 year old female who presents urgent care with complaints of nausea, vomiting, migraine headache, poor p.o. intake, fevers, shortness of breath, body aches, scratchy throat.  She reports that most of her symptoms started about a week ago.  Initially she did have a very sore throat and someone she lives with had strep throat but the throat is now more scratchy then painful.  She reports that she has no appetite and any thing that she eats or drinks usually gets vomited back up.  She reports that her vomit is very yellow with Itati Brocksmith flecks.  The main vomiting just started yesterday and was very violent.  She had mostly dry heaving prior but relates that she really did not have much to throw up.  She relates some associated things that she felt may be contributing to her symptoms this includes using THC vapes as well as starting to donate plasma the last 2 weeks with feeling nauseated and with no appetite afterwards.  She denies any hematuria, dysuria, constipation, cough but does have a lot of mucus.  She has noted some associated dizziness and feels this is from being dehydrated.  She also feels very fatigued.  She did try Zofran  at home but this did not help.  She is also requesting a pregnancy test today.      Emesis Associated symptoms: abdominal pain, chills, diarrhea, fever, headaches (migraine) and sore throat (scratchy)   Associated symptoms: no arthralgias and no cough     Past Medical History:  Diagnosis Date   Acute biliary pancreatitis 07/24/2022   ADHD (attention deficit hyperactivity disorder), combined type 03/10/2016   Adjustment disorder with depressed mood 03/10/2016   Asthma    Attention deficit hyperactivity disorder (ADHD)    Environmental allergies    Gallstone  pancreatitis 07/22/2022   Generalized anxiety disorder 03/10/2016   Lactose intolerance    Moderate persistent asthma without complication 08/22/2019   Last Assessment & Plan:   Resume maintenance therapy, this time with Advair Diskus, as Flovent  was ineffective historically.   Nausea 05/26/2023   Primary insomnia 08/22/2019   Last Assessment & Plan:   Managed by Percell Boyers at [facility]   UTI (urinary tract infection)     Patient Active Problem List   Diagnosis Date Noted   Anemia in pregnancy 05/20/2023   Family history of Down syndrome 03/09/2023   Elevated BMI (pBMI 37) 08/22/2019    Past Surgical History:  Procedure Laterality Date   ADENOIDECTOMY     CHOLECYSTECTOMY N/A 07/23/2022   Procedure: LAPAROSCOPIC CHOLECYSTECTOMY WITH INTRAOPERATIVE CHOLANGIOGRAM;  Surgeon: Caralyn Chandler, MD;  Location: [facility];  Service: General;  Laterality: N/A;   TONSILLECTOMY AND ADENOIDECTOMY      OB History     Gravida  1   Para  1   Term  1   Preterm      AB      Living  1      SAB      IAB      Ectopic      Multiple  0   Live Births  1            Home Medications    Prior to Admission medications  Medication Sig Start Date End Date Taking? Authorizing Provider  ondansetron  (ZOFRAN -ODT) 4 MG disintegrating tablet Take 1 tablet (4 mg total) by mouth every 8 (eight) hours as needed for nausea or vomiting. 04/20/24  Yes Remington Highbaugh A, PA-C  albuterol  (VENTOLIN  HFA) 108 (90 Base) MCG/ACT inhaler Inhale 1-2 puffs into the lungs every 4 (four) hours as needed for wheezing or shortness of breath. 08/26/23   Cresenzo, Mardee Shackle, MD  ibuprofen  (ADVIL ) 600 MG tablet Take 1 tablet (600 mg total) by mouth every 6 (six) hours. 01/30/24   Collins Dean, NP    Family History Family History  Problem Relation Age of Onset   Anxiety disorder Mother    Depression Mother    Heart disease Mother    Asthma Mother    Hypertension Mother    Alcohol  abuse Father    Heart disease Father    Diabetes Father    Cancer Maternal Grandfather    Heart disease Maternal Grandfather    Diabetes Maternal Grandfather    Stroke Maternal Grandfather    Hypertension Maternal Grandfather    COPD Paternal Grandfather    Diabetes Paternal Grandfather     Social History Social History   Tobacco Use   Smoking status: Never    Passive exposure: Yes   Smokeless tobacco: Never  Vaping Use   Vaping status: Former  Substance Use Topics   Alcohol use: Not Currently   Drug use: Not Currently    Types: Marijuana    Comment: + 01/08/23     Allergies   Lactose intolerance (gi), Egg-derived products, Molds & smuts, and Penicillins   Review of Systems Review of Systems  Constitutional:  Positive for appetite change, chills, fatigue and fever.  HENT:  Positive for congestion, sinus pressure, sinus pain and sore throat (scratchy). Negative for ear pain.   Eyes:  Negative for pain and visual disturbance.  Respiratory:  Positive for shortness of breath. Negative for cough.   Cardiovascular:  Negative for chest pain and palpitations.  Gastrointestinal:  Positive for abdominal pain, diarrhea, nausea and vomiting.  Genitourinary:  Negative for dysuria and hematuria.  Musculoskeletal:  Negative for arthralgias and back pain.  Skin:  Negative for color change and rash.  Neurological:  Positive for headaches (migraine). Negative for seizures and syncope.  All other systems reviewed and are negative.    Physical Exam Triage Vital Signs ED Triage Vitals  Encounter Vitals Group     BP 04/20/24 1545 (!) 114/57     Systolic BP Percentile --      Diastolic BP Percentile --      Pulse Rate 04/20/24 1545 60     Resp 04/20/24 1545 16     Temp 04/20/24 1545 (!) 97.5 F (36.4 C)     Temp Source 04/20/24 1545 Oral     SpO2 04/20/24 1545 98 %     Weight --      Height --      Head Circumference --      Peak Flow --      Pain Score 04/20/24 1549 0      Pain Loc --      Pain Education --      Exclude from Growth Chart --    No data found.  Updated Vital Signs BP (!) 114/57 (BP Location: Right Arm)   Pulse 60   Temp (!) 97.5 F (36.4 C) (Oral)   Resp 16   LMP  (LMP Unknown)  SpO2 98%   Breastfeeding No   Visual Acuity Right Eye Distance:   Left Eye Distance:   Bilateral Distance:    Right Eye Near:   Left Eye Near:    Bilateral Near:     Physical Exam Vitals and nursing note reviewed.  Constitutional:      General: She is not in acute distress.    Appearance: She is well-developed. She is ill-appearing. She is not toxic-appearing.  HENT:     Head: Normocephalic and atraumatic.     Right Ear: Tympanic membrane normal.     Left Ear: Tympanic membrane normal.     Nose: Nose normal.     Mouth/Throat:     Mouth: Mucous membranes are dry.     Pharynx: Posterior oropharyngeal erythema (Very mild) present. No oropharyngeal exudate.  Eyes:     Conjunctiva/sclera: Conjunctivae normal.  Cardiovascular:     Rate and Rhythm: Normal rate and regular rhythm.     Heart sounds: No murmur heard. Pulmonary:     Effort: Pulmonary effort is normal. No respiratory distress.     Breath sounds: Examination of the right-lower field reveals decreased breath sounds. Examination of the left-lower field reveals decreased breath sounds. Decreased breath sounds present. No wheezing or rhonchi.     Comments: Mild decreased breath sounds in the bases Abdominal:     General: Bowel sounds are normal.     Palpations: Abdomen is soft.     Tenderness: There is no abdominal tenderness.  Musculoskeletal:        General: No swelling.     Cervical back: Neck supple.  Skin:    General: Skin is warm and dry.     Capillary Refill: Capillary refill takes less than 2 seconds.  Neurological:     General: No focal deficit present.     Mental Status: She is alert and oriented to person, place, and time.  Psychiatric:        Mood and Affect: Mood normal.       UC Treatments / Results  Labs (all labs ordered are listed, but only abnormal results are displayed) Labs Reviewed  POCT URINALYSIS DIP (MANUAL ENTRY) - Abnormal; Notable for the following components:      Result Value   Clarity, UA turbid (*)    Glucose, UA =100 (*)    Bilirubin, UA small (*)    Spec Grav, UA >=1.030 (*)    Blood, UA large (*)    Protein Ur, POC =30 (*)    All other components within normal limits  POCT URINE PREGNANCY - Normal    EKG   Radiology No results found.  Procedures Procedures (including critical care time)  Medications Ordered in UC Medications  ondansetron  (ZOFRAN -ODT) disintegrating tablet 4 mg (4 mg Oral Given 04/20/24 1657)  ketorolac  (TORADOL ) 30 MG/ML injection 30 mg (30 mg Intramuscular Given 04/20/24 1657)  sodium chloride  0.9 % bolus 1,000 mL (1,000 mLs Intravenous New Bag/Given 04/20/24 1658)    Initial Impression / Assessment and Plan / UC Course  I have reviewed the triage vital signs and the nursing notes.  Pertinent labs & imaging results that were available during my care of the patient were reviewed by me and considered in my medical decision making (see chart for details).     Shortness of breath - Plan: CANCELED: DG Chest 2 View, CANCELED: DG Chest 2 View  Bilious vomiting with nausea  Other fatigue  Dehydration  Other headache syndrome  Gastroenteritis  The patient presented with numerous complaints including bilious vomiting with nausea, headache, fatigue, shortness of breath and dehydration.  On initial assessment she did appear ill but not toxic.  Her vital signs were stable upon admission.  We initially treated this with antinausea medicine, 1000 cc normal saline fluid bolus and Toradol  for the headache.  After fluid bolus and medication the patient reported that her headache was only mild but that she still had some persistent nausea but was able to keep fluids down.  Her physical exam findings showed some  mild decrease in the bases of her lungs but no wheezing or rhonchi, mildly tender abdominal exam, mildly dry mucous membranes.  Her pregnancy test was negative.  Her urinalysis was suggestive of dehydration.  Given the improvement in symptoms but not complete resolution we have opted to send the patient home with strict return precautions should she develop inability maintain her fluids or worsening of her other symptoms.  The patient expresses understanding.  Final Clinical Impressions(s) / UC Diagnoses   Final diagnoses:  Shortness of breath  Bilious vomiting with nausea  Other fatigue  Dehydration  Other headache syndrome  Gastroenteritis     Discharge Instructions      Nausea, vomiting, headache and dehydration likely secondary to a gastroenteritis.  We have treated today with antinausea medicine, injectable pain medication and a fluid bolus.  There has been improvement in symptoms.  Since you are able to keep fluids down without vomiting then it is reasonable to send you home with strict return precautions if your symptoms worsen or you are unable to keep fluids down again.  We have treated with the following today: Zofran  4 mg orally disintegrating tablet every 8 hours as needed for nausea. Dose given today around 5:30 pm, next dose would be due around 1:30 am.  Toradol  injection given today. This is a medication to help with pain. This is not a narcotic.  1000 cc of normal saline given today for hydration Drink plenty of fluids even if you do not feel like keeping in solids. If you become unable to keep fluids down due to persistent vomiting or your headache worsens again then recommend going to the emergency department for further evaluation   ED Prescriptions     Medication Sig Dispense Auth. Provider   ondansetron  (ZOFRAN -ODT) 4 MG disintegrating tablet Take 1 tablet (4 mg total) by mouth every 8 (eight) hours as needed for nausea or vomiting. 20 tablet Kreg Pesa, [place]       PDMP not reviewed this encounter.   Kreg Pesa, [place] 04/20/24 [phone]

## 2024-04-20 NOTE — ED Notes (Signed)
 Pt provided with cup of water for PO challenge. Pt states after a sip, her stomach "began to bubble." Pt requested ginger ale, with which she was provided. PA notified.

## 2024-04-20 NOTE — Discharge Instructions (Addendum)
 Nausea, vomiting, headache and dehydration likely secondary to a gastroenteritis.  We have treated today with antinausea medicine, injectable pain medication and a fluid bolus.  There has been improvement in symptoms.  Since you are able to keep fluids down without vomiting then it is reasonable to send you home with strict return precautions if your symptoms worsen or you are unable to keep fluids down again.  We have treated with the following today: Zofran  4 mg orally disintegrating tablet every 8 hours as needed for nausea. Dose given today around 5:30 pm, next dose would be due around 1:30 am.  Toradol  injection given today. This is a medication to help with pain. This is not a narcotic.  1000 cc of normal saline given today for hydration Drink plenty of fluids even if you do not feel like keeping in solids. If you become unable to keep fluids down due to persistent vomiting or your headache worsens again then recommend going to the emergency department for further evaluation

## 2024-04-20 NOTE — ED Triage Notes (Addendum)
 Pt states nausea for one week states she vomited last night thinks she might be pregnant. States she is feeling fatigued and dizzy. States she has been having cold symptoms all week.  Also states she smoked a THC wax last night.

## 2024-05-15 ENCOUNTER — Telehealth: Admitting: Physician Assistant

## 2024-05-15 DIAGNOSIS — R051 Acute cough: Secondary | ICD-10-CM

## 2024-05-15 NOTE — Progress Notes (Signed)
 Pt seeking nebulizer. Advised we can only order medication not machine. [facility]

## 2024-06-22 ENCOUNTER — Telehealth

## 2024-06-22 DIAGNOSIS — J4521 Mild intermittent asthma with (acute) exacerbation: Secondary | ICD-10-CM | POA: Diagnosis not present

## 2024-06-23 MED ORDER — VENTOLIN HFA 108 (90 BASE) MCG/ACT IN AERS: 1.0000 | INHALATION_SPRAY | Freq: Four times a day (QID) | RESPIRATORY_TRACT | 0 refills | Status: DC | PRN

## 2024-06-23 NOTE — Progress Notes (Signed)
 I have spent 5 minutes in review of e-visit questionnaire, review and updating patient chart, medical decision making and response to patient.   Piedad Climes, PA-C

## 2024-06-23 NOTE — Progress Notes (Signed)
E Visit for Asthma  Based on what you have shared with me, it looks like you may have a flare up of your asthma.  Asthma is a chronic (ongoing) lung disease which results in airway obstruction, inflammation and hyper-responsiveness.   Asthma symptoms vary from person to person, with common symptoms including nighttime awakening and decreased ability to participate in normal activities as a result of shortness of breath. It is often triggered by changes in weather, changes in the season, changes in air temperature, or inside (home, school, daycare or work) allergens such as animal dander, mold, mildew, woodstoves or cockroaches.   It can also be triggered by hormonal changes, extreme emotion, physical exertion or an upper respiratory tract illness.     It is important to identify the trigger, and then eliminate or avoid the trigger if possible.   If you have been prescribed medications to be taken on a regular basis, it is important to follow the asthma action plan and to follow guidelines to adjust medication in response to increasing symptoms of decreased peak expiratory flow rate  Treatment: I have prescribed: Albuterol (Proventil HFA; Ventolin HFA) 108 (90 Base) MCG/ACT Inhaler 2 puffs into the lungs every six hours as needed for wheezing or shortness of breath  HOME CARE . Only take medications as instructed by your medical team. . Consider wearing a mask or scarf to improve breathing air temperature have been shown to decrease irritation and decrease exacerbations . Get rest. . Taking a steamy shower or using a humidifier may help nasal congestion sand ease sore throat pain. You can place a towel over your head and breathe in the steam from hot water coming from a faucet. . Using a saline nasal spray works much the same way.  . Cough drops, hare candies and sore throat lozenges may  ease your cough.  . Avoid close contacts especially the very you and the elderly . Cover your mouth if you cough or sneeze . Always remember to wash your hands.    GET HELP RIGHT AWAY IF: . You develop worsening symptoms; breathlessness at rest, drowsy, confused or agitated, unable to speak in full sentences . You have coughing fits . You develop a severe headache or visual changes . You develop shortness of breath, difficulty breathing or start having chest pain . Your symptoms persist after you have completed your treatment plan . If your symptoms do not improve within 10 days  MAKE SURE YOU . Understand these instructions. . Will watch your condition. . Will get help right away if you are not doing well or get worse.   Your e-visit answers were reviewed by a board certified advanced clinical practitioner to complete your personal care plan, Depending upon the condition, your plan could have included both over the counter or prescription medications.  Please review your pharmacy choice. Your safety is important to us. If you have drug allergies check your prescription carefully. You can use MyChart to ask questions about today's visit, request a non-urgent call back, or ask for a work or school excuse for 24 hours related to this e-Visit. If it has been greater than 24 hours you will need to follow up with your provider, or enter a new e-Visit to address those concerns.  You will get an e-mail in the next two days asking about your experience. I hope that your e-visit has been valuable and will speed your recovery. Thank you for using e-visits. 

## 2024-08-11 ENCOUNTER — Encounter (HOSPITAL_COMMUNITY): Payer: Self-pay | Admitting: *Deleted

## 2024-08-11 ENCOUNTER — Other Ambulatory Visit: Payer: Self-pay

## 2024-08-11 ENCOUNTER — Ambulatory Visit (HOSPITAL_COMMUNITY)
Admission: EM | Admit: 2024-08-11 | Discharge: 2024-08-11 | Disposition: A | Discharge status: Home / Self Care | Attending: Internal Medicine | Admitting: Internal Medicine

## 2024-08-11 DIAGNOSIS — J4521 Mild intermittent asthma with (acute) exacerbation: Secondary | ICD-10-CM

## 2024-08-11 DIAGNOSIS — R509 Fever, unspecified: Secondary | ICD-10-CM

## 2024-08-11 DIAGNOSIS — J029 Acute pharyngitis, unspecified: Secondary | ICD-10-CM | POA: Diagnosis not present

## 2024-08-11 LAB — POC SARS CORONAVIRUS 2 AG -  ED: SARS Coronavirus 2 Ag: NEGATIVE

## 2024-08-11 MED ORDER — ALBUTEROL SULFATE (2.5 MG/3ML) 0.083% IN NEBU: 2.5000 mg | INHALATION_SOLUTION | Freq: Four times a day (QID) | RESPIRATORY_TRACT | 3 refills | Status: DC | PRN

## 2024-08-11 MED ORDER — PREDNISONE 20 MG PO TABS
40.0000 mg | ORAL_TABLET | Freq: Every day | ORAL | 0 refills | Status: AC
Start: 2024-08-11 — End: 2024-08-16

## 2024-08-11 MED ORDER — MONTELUKAST SODIUM 10 MG PO TABS: 10.0000 mg | ORAL_TABLET | Freq: Every day | ORAL | 2 refills | Status: AC

## 2024-08-11 NOTE — ED Triage Notes (Signed)
 PT reports that she and her Daughter were visiting Grandmother and Aunt on [place]. On Sunday the Grandmother and Aunt tested positive for COVID. Pt has a sore throat,chills,fever 102 on Wednesday. Pt took tylenol  for fever . Temp 98.2 in triage. Pt wants Covid testing for her and her Daughter.

## 2024-08-11 NOTE — Discharge Instructions (Addendum)
 COVID testing done today.  This is negative. Symptoms are most consistent with a viral infection.  This does not require antibiotic treatment. This has caused an exacerbation of asthma.  We will treat this with at home nebulizing machine along with a burst of prednisone  to help control airway inflammation.  We will treat with the following: Prednisone  40 mg (2 tablets) once daily for 5 days. Take this in the morning.  This is a steroid to help with inflammation and pain.  Albuterol  nebulizing treatment every 6 hours as needed for wheezing or shortness of breath Continue albuterol  inhaler 1 to 2 puffs every 6 hours as needed for wheezing or shortness of breath Recommend starting Singulair  (montelukast ) 10 mg daily to help control asthma symptoms.  Make sure to stay hydrated by drinking plenty of water. Return to urgent care or PCP if symptoms worsen or fail to resolve.

## 2024-08-11 NOTE — ED Provider Notes (Signed)
 [facility]    CSN: [id number] Arrival date & time: 08/11/24  1817      History   Chief Complaint Chief Complaint  Patient presents with   Sore Throat    HPI Janice Nicholson is a 23 y.o. female.   23 year old female presents urgent care with complaints of sore throat, fevers, chills and congestion.  She has also had exacerbation of her asthma secondary to this leading to increased shortness of breath and wheezing.  She reports that she was exposed to COVID as well as her 84-month-old daughter.  This was while they were with family recently.  She was concerned and wanted to be tested for COVID.  She reports that with her asthma exacerbation she has been using a family members albuterol  nebulizer and this has improved her symptoms.  She has an inhaler that she used regular.  She is eating and drinking.   Sore Throat Associated symptoms include shortness of breath. Pertinent negatives include no chest pain and no abdominal pain.    Past Medical History:  Diagnosis Date   Acute biliary pancreatitis 07/24/2022   ADHD (attention deficit hyperactivity disorder), combined type 03/10/2016   Adjustment disorder with depressed mood 03/10/2016   Asthma    Attention deficit hyperactivity disorder (ADHD)    Environmental allergies    Gallstone pancreatitis 07/22/2022   Generalized anxiety disorder 03/10/2016   Lactose intolerance    Moderate persistent asthma without complication 08/22/2019   Last Assessment & Plan:   Resume maintenance therapy, this time with Advair Diskus, as Flovent  was ineffective historically.   Nausea 05/26/2023   Primary insomnia 08/22/2019   Last Assessment & Plan:   Managed by Stephane Fraction at [facility]   UTI (urinary tract infection)     Patient Active Problem List   Diagnosis Date Noted   Anemia in pregnancy 05/20/2023   Family history of Down syndrome 03/09/2023   Elevated BMI (pBMI 37) 08/22/2019    Past Surgical  History:  Procedure Laterality Date   ADENOIDECTOMY     CHOLECYSTECTOMY N/A 07/23/2022   Procedure: LAPAROSCOPIC CHOLECYSTECTOMY WITH INTRAOPERATIVE CHOLANGIOGRAM;  Surgeon: Curvin Deward MOULD, MD;  Location: [facility];  Service: General;  Laterality: N/A;   TONSILLECTOMY AND ADENOIDECTOMY      OB History     Gravida  1   Para  1   Term  1   Preterm      AB      Living  1      SAB      IAB      Ectopic      Multiple  0   Live Births  1            Home Medications    Prior to Admission medications   Medication Sig Start Date End Date Taking? Authorizing Provider  albuterol  (PROVENTIL ) (2.5 MG/3ML) 0.083% nebulizer solution Take 3 mLs (2.5 mg total) by nebulization every 6 (six) hours as needed for wheezing or shortness of breath. 08/11/24  Yes Nazir Hacker A, PA-C  montelukast  (SINGULAIR ) 10 MG tablet Take 1 tablet (10 mg total) by mouth at bedtime. 08/11/24  Yes Dessirae Scarola A, PA-C  phentermine (ADIPEX-P) 37.5 MG tablet Take 37.5 mg by mouth every morning. 05/02/24  Yes [provider]  predniSONE  (DELTASONE ) 20 MG tablet Take 2 tablets (40 mg total) by mouth daily with breakfast for 5 days. 08/11/24 08/16/24 Yes Jaclynn Laumann A, PA-C  albuterol  (VENTOLIN  HFA) 108 (90 Base)  MCG/ACT inhaler Inhale 1-2 puffs into the lungs every 6 (six) hours as needed for wheezing or shortness of breath. 06/23/24   Gladis Elsie BROCKS, PA-C  ibuprofen  (ADVIL ) 600 MG tablet Take 1 tablet (600 mg total) by mouth every 6 (six) hours. 01/30/24   Theotis Haze ORN, NP    Family History Family History  Problem Relation Age of Onset   Anxiety disorder Mother    Depression Mother    Heart disease Mother    Asthma Mother    Hypertension Mother    Alcohol abuse Father    Heart disease Father    Diabetes Father    Cancer Maternal Grandfather    Heart disease Maternal Grandfather    Diabetes Maternal Grandfather    Stroke Maternal Grandfather    Hypertension Maternal Grandfather     COPD Paternal Grandfather    Diabetes Paternal Grandfather     Social History Social History   Tobacco Use   Smoking status: Never    Passive exposure: Yes   Smokeless tobacco: Never  Vaping Use   Vaping status: Former  Substance Use Topics   Alcohol use: Not Currently   Drug use: Not Currently    Types: Marijuana    Comment: + 01/08/23     Allergies   Lactose intolerance (gi), Egg-derived products, Molds & smuts, and Penicillins   Review of Systems Review of Systems  Constitutional:  Positive for chills and fever.  HENT:  Positive for congestion and sore throat. Negative for ear pain.   Eyes:  Negative for pain and visual disturbance.  Respiratory:  Positive for shortness of breath. Negative for cough.   Cardiovascular:  Negative for chest pain and palpitations.  Gastrointestinal:  Negative for abdominal pain and vomiting.  Genitourinary:  Negative for dysuria and hematuria.  Musculoskeletal:  Negative for arthralgias and back pain.  Skin:  Negative for color change and rash.  Neurological:  Negative for seizures and syncope.  All other systems reviewed and are negative.    Physical Exam Triage Vital Signs ED Triage Vitals [08/11/24 1927]  Encounter Vitals Group     BP 128/84     Girls Systolic BP Percentile      Girls Diastolic BP Percentile      Boys Systolic BP Percentile      Boys Diastolic BP Percentile      Pulse Rate 92     Resp 18     Temp 98.2 F (36.8 C)     Temp src      SpO2 92 %     Weight      Height      Head Circumference      Peak Flow      Pain Score 5     Pain Loc      Pain Education      Exclude from Growth Chart    No data found.  Updated Vital Signs BP 128/84   Pulse 92   Temp 98.2 F (36.8 C)   Resp 18   LMP 06/24/2024 (Approximate)   SpO2 92%   Visual Acuity Right Eye Distance:   Left Eye Distance:   Bilateral Distance:    Right Eye Near:   Left Eye Near:    Bilateral Near:     Physical Exam Vitals and  nursing note reviewed.  Constitutional:      General: She is not in acute distress.    Appearance: She is well-developed.  HENT:  Head: Normocephalic and atraumatic.     Nose: Congestion present.     Mouth/Throat:     Mouth: Mucous membranes are moist.     Pharynx: Posterior oropharyngeal erythema present. No pharyngeal swelling or oropharyngeal exudate.  Eyes:     Conjunctiva/sclera: Conjunctivae normal.  Cardiovascular:     Rate and Rhythm: Normal rate and regular rhythm.     Heart sounds: No murmur heard. Pulmonary:     Effort: Pulmonary effort is normal. No tachypnea or respiratory distress.     Breath sounds: Examination of the right-upper field reveals wheezing. Examination of the left-upper field reveals wheezing. Wheezing present. No decreased breath sounds or rhonchi.  Abdominal:     Palpations: Abdomen is soft.     Tenderness: There is no abdominal tenderness.  Musculoskeletal:        General: No swelling.     Cervical back: Neck supple.  Skin:    General: Skin is warm and dry.     Capillary Refill: Capillary refill takes less than 2 seconds.  Neurological:     Mental Status: She is alert.  Psychiatric:        Mood and Affect: Mood normal.      UC Treatments / Results  Labs (all labs ordered are listed, but only abnormal results are displayed) Labs Reviewed  POC SARS CORONAVIRUS 2 AG -  ED    EKG   Radiology No results found.  Procedures Procedures (including critical care time)  Medications Ordered in UC Medications - No data to display  Initial Impression / Assessment and Plan / UC Course  I have reviewed the triage vital signs and the nursing notes.  Pertinent labs & imaging results that were available during my care of the patient were reviewed by me and considered in my medical decision making (see chart for details).     Sore throat - Plan: POC SARS Coronavirus 2 Ag-ED - Nasal Swab, POC SARS Coronavirus 2 Ag-ED - Nasal Swab  Fever in  adult - Plan: POC SARS Coronavirus 2 Ag-ED - Nasal Swab, POC SARS Coronavirus 2 Ag-ED - Nasal Swab  Mild intermittent asthma with acute exacerbation   COVID testing done today.  This is negative. Symptoms are most consistent with a viral infection.  This does not require antibiotic treatment. This has caused an exacerbation of asthma.  We will treat this with at home nebulizing machine along with a burst of prednisone  to help control airway inflammation.  We will treat with the following: Prednisone  40 mg (2 tablets) once daily for 5 days. Take this in the morning.  This is a steroid to help with inflammation and pain.  Albuterol  nebulizing treatment every 6 hours as needed for wheezing or shortness of breath Continue albuterol  inhaler 1 to 2 puffs every 6 hours as needed for wheezing or shortness of breath Recommend starting Singulair  (montelukast ) 10 mg daily to help control asthma symptoms.  Make sure to stay hydrated by drinking plenty of water. Return to urgent care or PCP if symptoms worsen or fail to resolve.    Final Clinical Impressions(s) / UC Diagnoses   Final diagnoses:  Sore throat  Fever in adult  Mild intermittent asthma with acute exacerbation     Discharge Instructions      COVID testing done today.  This is negative. Symptoms are most consistent with a viral infection.  This does not require antibiotic treatment. This has caused an exacerbation of asthma.  We will treat this with  at home nebulizing machine along with a burst of prednisone  to help control airway inflammation.  We will treat with the following: Prednisone  40 mg (2 tablets) once daily for 5 days. Take this in the morning.  This is a steroid to help with inflammation and pain.  Albuterol  nebulizing treatment every 6 hours as needed for wheezing or shortness of breath Continue albuterol  inhaler 1 to 2 puffs every 6 hours as needed for wheezing or shortness of breath Recommend starting Singulair  (montelukast )  10 mg daily to help control asthma symptoms.  Make sure to stay hydrated by drinking plenty of water. Return to urgent care or PCP if symptoms worsen or fail to resolve.      ED Prescriptions     Medication Sig Dispense Auth. Provider   albuterol  (PROVENTIL ) (2.5 MG/3ML) 0.083% nebulizer solution Take 3 mLs (2.5 mg total) by nebulization every 6 (six) hours as needed for wheezing or shortness of breath. 75 mL Krisanne Lich A, PA-C   predniSONE  (DELTASONE ) 20 MG tablet Take 2 tablets (40 mg total) by mouth daily with breakfast for 5 days. 10 tablet Teresa Almarie LABOR, PA-C   montelukast  (SINGULAIR ) 10 MG tablet Take 1 tablet (10 mg total) by mouth at bedtime. 30 tablet Teresa Almarie LABOR, [place]      PDMP not reviewed this encounter.   Teresa Almarie LABOR, [place] 08/11/24 2019

## 2024-11-15 ENCOUNTER — Encounter (HOSPITAL_COMMUNITY): Payer: Self-pay

## 2024-11-15 ENCOUNTER — Ambulatory Visit (HOSPITAL_COMMUNITY): Admission: EM | Admit: 2024-11-15 | Discharge: 2024-11-15 | Disposition: A | Discharge status: Home / Self Care

## 2024-11-15 DIAGNOSIS — J4521 Mild intermittent asthma with (acute) exacerbation: Secondary | ICD-10-CM

## 2024-11-15 DIAGNOSIS — J00 Acute nasopharyngitis [common cold]: Secondary | ICD-10-CM

## 2024-11-15 MED ORDER — FLUTICASONE PROPIONATE 50 MCG/ACT NA SUSP: 1.0000 | Freq: Every day | NASAL | 0 refills | Status: AC

## 2024-11-15 MED ORDER — ALBUTEROL SULFATE (2.5 MG/3ML) 0.083% IN NEBU: 2.5000 mg | INHALATION_SOLUTION | Freq: Four times a day (QID) | RESPIRATORY_TRACT | 3 refills | Status: AC | PRN

## 2024-11-15 MED ORDER — PROMETHAZINE-DM 6.25-15 MG/5ML PO SYRP: 5.0000 mL | ORAL_SOLUTION | Freq: Every evening | ORAL | 0 refills | Status: AC | PRN

## 2024-11-15 MED ORDER — ALBUTEROL SULFATE HFA 108 (90 BASE) MCG/ACT IN AERS: 1.0000 | INHALATION_SPRAY | Freq: Four times a day (QID) | RESPIRATORY_TRACT | 0 refills | Status: AC | PRN

## 2024-11-15 MED ORDER — PSEUDOEPHEDRINE-GUAIFENESIN ER 120-1200 MG PO TB12: 1.0000 | ORAL_TABLET | Freq: Two times a day (BID) | ORAL | 0 refills | Status: AC | PRN

## 2024-11-15 NOTE — ED Triage Notes (Signed)
 Paatient reports that she has had nasal congestion with green mucus, sore throat, body aches, headache, and an intermittent productive cough with green sputum.   Patient reports that she has used her Albuterol  nebulizer, cough drops, TheraFlu, and Tylenol  for her symptoms.

## 2024-11-15 NOTE — ED Provider Notes (Signed)
 " [facility]    CSN: [id number] Arrival date & time: 11/15/24  0803      History   Chief Complaint Chief Complaint  Patient presents with   Nasal Congestion   Headache   Sore Throat   Cough   Generalized Body Aches    HPI Janice Nicholson is a 23 y.o. female.   Ms. Roesler presents today with complaint of illness that began 7 days ago and has been unchanged since onset.  She reports that symptoms began with low level body aches and headaches that have since improved.  She reports a single elevated temperature of 99.5 early in illness.  Since this time she has had sinus congestion, nasal congestion, scratchy throat, and postnasal drip.  She reports that she has a sometimes productive cough that has been keeping her awake at nighttime.  She endorses mild intermittent chest tightness and wheezing which is alleviated by using her albuterol  nebulizer treatment.  She reports mild intermittent nausea without vomiting, denies diarrhea.  She denies overt shortness of breath-she has a somewhat active job which she has been able to do without difficulty.  In addition to her albuterol  nebulizer she has been taking TheraFlu without relief.  She has also been doing tea with lemon and honey to manage sore throat and cough.  The history is provided by the patient.  Headache Associated symptoms: congestion, cough, drainage, fatigue, myalgias, nausea, sinus pressure and sore throat   Associated symptoms: no diarrhea, no ear pain, no fever and no vomiting   Sore Throat Associated symptoms include headaches. Pertinent negatives include no shortness of breath.  Cough Associated symptoms: headaches, myalgias, sore throat and wheezing   Associated symptoms: no chills, no diaphoresis, no ear pain, no fever, no rash, no rhinorrhea and no shortness of breath     Past Medical History:  Diagnosis Date   Acute biliary pancreatitis 07/24/2022   ADHD (attention deficit hyperactivity disorder), combined  type 03/10/2016   Adjustment disorder with depressed mood 03/10/2016   Asthma    Attention deficit hyperactivity disorder (ADHD)    Environmental allergies    Gallstone pancreatitis 07/22/2022   Generalized anxiety disorder 03/10/2016   Lactose intolerance    Moderate persistent asthma without complication 08/22/2019   Last Assessment & Plan:   Resume maintenance therapy, this time with Advair Diskus, as Flovent  was ineffective historically.   Nausea 05/26/2023   Primary insomnia 08/22/2019   Last Assessment & Plan:   Managed by Stephane Fraction at [facility]   UTI (urinary tract infection)     Patient Active Problem List   Diagnosis Date Noted   Anemia in pregnancy 05/20/2023   Family history of Down syndrome 03/09/2023   Elevated BMI (pBMI 37) 08/22/2019    Past Surgical History:  Procedure Laterality Date   ADENOIDECTOMY     CHOLECYSTECTOMY N/A 07/23/2022   Procedure: LAPAROSCOPIC CHOLECYSTECTOMY WITH INTRAOPERATIVE CHOLANGIOGRAM;  Surgeon: Curvin Deward MOULD, MD;  Location: [facility];  Service: General;  Laterality: N/A;   TONSILLECTOMY AND ADENOIDECTOMY      OB History     Gravida  1   Para  1   Term  1   Preterm      AB      Living  1      SAB      IAB      Ectopic      Multiple  0   Live Births  1  Home Medications    Prior to Admission medications  Medication Sig Start Date End Date Taking? Authorizing Provider  fluticasone  (FLONASE ) 50 MCG/ACT nasal spray Place 1 spray into both nostrils daily. 11/15/24  Yes Leatrice Vernell HERO, NP  promethazine -dextromethorphan (PROMETHAZINE -DM) 6.25-15 MG/5ML syrup Take 5 mLs by mouth at bedtime as needed for cough. 11/15/24  Yes Leatrice Vernell HERO, NP  Pseudoephedrine -Guaifenesin  (MUCINEX  D MAX STRENGTH) [phone] MG TB12 Take 1 tablet by mouth 2 (two) times daily as needed. 11/15/24  Yes Leatrice Vernell HERO, NP  albuterol  (PROVENTIL ) (2.5 MG/3ML) 0.083% nebulizer solution Take 3 mLs  (2.5 mg total) by nebulization every 6 (six) hours as needed for wheezing or shortness of breath. 11/15/24   Leatrice Vernell HERO, NP  albuterol  (VENTOLIN  HFA) 108 (90 Base) MCG/ACT inhaler Inhale 1-2 puffs into the lungs every 6 (six) hours as needed for wheezing or shortness of breath. 11/15/24   Leatrice Vernell HERO, NP  ibuprofen  (ADVIL ) 600 MG tablet Take 1 tablet (600 mg total) by mouth every 6 (six) hours. Patient not taking: Reported on 11/15/2024 01/30/24   Fleming, Zelda W, NP  montelukast  (SINGULAIR ) 10 MG tablet Take 1 tablet (10 mg total) by mouth at bedtime. Patient not taking: Reported on 11/15/2024 08/11/24   Teresa Almarie LABOR, PA-C  phentermine (ADIPEX-P) 37.5 MG tablet Take 37.5 mg by mouth every morning. Patient not taking: Reported on 11/15/2024 05/02/24   [provider]    Family History Family History  Problem Relation Age of Onset   Anxiety disorder Mother    Depression Mother    Heart disease Mother    Asthma Mother    Hypertension Mother    Alcohol abuse Father    Heart disease Father    Diabetes Father    Cancer Maternal Grandfather    Heart disease Maternal Grandfather    Diabetes Maternal Grandfather    Stroke Maternal Grandfather    Hypertension Maternal Grandfather    COPD Paternal Grandfather    Diabetes Paternal Grandfather     Social History Social History[1]   Allergies   Lactose intolerance (gi), Egg protein-containing drug products, Molds & smuts, and Penicillins   Review of Systems Review of Systems  Constitutional:  Positive for fatigue. Negative for activity change, appetite change, chills, diaphoresis and fever.  HENT:  Positive for congestion, postnasal drip, sinus pressure and sore throat. Negative for ear pain, rhinorrhea and sinus pain.   Respiratory:  Positive for cough, chest tightness and wheezing. Negative for shortness of breath.   Gastrointestinal:  Positive for nausea. Negative for diarrhea and vomiting.   Musculoskeletal:  Positive for myalgias.  Skin:  Negative for rash.  Neurological:  Positive for headaches.     Physical Exam Triage Vital Signs ED Triage Vitals [11/15/24 0828]  Encounter Vitals Group     BP 110/70     Girls Systolic BP Percentile      Girls Diastolic BP Percentile      Boys Systolic BP Percentile      Boys Diastolic BP Percentile      Pulse Rate 83     Resp 16     Temp (!) 97.4 F (36.3 C)     Temp Source Oral     SpO2 96 %     Weight      Height      Head Circumference      Peak Flow      Pain Score      Pain Loc  Pain Education      Exclude from Growth Chart    No data found.  Updated Vital Signs BP 110/70 (BP Location: Left Arm)   Pulse 83   Temp (!) 97.4 F (36.3 C) (Oral)   Resp 16   SpO2 96%   Visual Acuity Right Eye Distance:   Left Eye Distance:   Bilateral Distance:    Right Eye Near:   Left Eye Near:    Bilateral Near:     Physical Exam Vitals and nursing note reviewed.  Constitutional:      General: She is not in acute distress.    Appearance: Normal appearance. She is normal weight. She is not toxic-appearing.  HENT:     Head: Normocephalic.     Right Ear: Ear canal and external ear normal. A middle ear effusion is present. There is no impacted cerumen. No mastoid tenderness. No hemotympanum. Tympanic membrane is not injected, erythematous or bulging.     Left Ear: Ear canal and external ear normal. A middle ear effusion is present. There is no impacted cerumen. No mastoid tenderness. No hemotympanum. Tympanic membrane is not injected, erythematous or bulging.     Ears:     Comments: Serous fluid present behind bilat TM's     Nose: Congestion present. No rhinorrhea.     Mouth/Throat:     Mouth: Mucous membranes are moist.     Pharynx: Oropharynx is clear. Postnasal drip present. No oropharyngeal exudate or posterior oropharyngeal erythema.     Tonsils: No tonsillar exudate or tonsillar abscesses. 1+ on the right. 1+  on the left.  Eyes:     Conjunctiva/sclera: Conjunctivae normal.  Cardiovascular:     Rate and Rhythm: Normal rate and regular rhythm.     Heart sounds: Normal heart sounds.  Pulmonary:     Effort: Pulmonary effort is normal.     Breath sounds: Normal breath sounds.  Musculoskeletal:     Cervical back: Neck supple.  Lymphadenopathy:     Cervical: No cervical adenopathy.  Skin:    General: Skin is warm and dry.  Neurological:     Mental Status: She is alert and oriented to person, place, and time.  Psychiatric:        Mood and Affect: Mood normal.        Behavior: Behavior normal.      UC Treatments / Results  Labs (all labs ordered are listed, but only abnormal results are displayed) Labs Reviewed - No data to display  EKG   Radiology No results found.  Procedures Procedures (including critical care time)  Medications Ordered in UC Medications - No data to display  Initial Impression / Assessment and Plan / UC Course  I have reviewed the triage vital signs and the nursing notes.  Pertinent labs & imaging results that were available during my care of the patient were reviewed by me and considered in my medical decision making (see chart for details).     COVID and flu testing not completed today as patient has had symptoms for 7 days.  No evidence of acute otitis media or bacterial sinus infection noted on exam.  Lungs were clear to auscultation.  Will continue supportive measures.  I am discontinuing TheraFlu and starting Mucinex  D and Flonase  in the morning followed by Promethazine  DM in the evening.  She will continue albuterol  nebs as needed for chest tightness and wheezing-this will be refilled today.  Emphasis on sinus drainage to prevent secondary bacterial infection  Final Clinical Impressions(s) / UC Diagnoses   Final diagnoses:  Nasopharyngitis     Discharge Instructions      Diagnosis: Nasopharyngitis (Common Cold) with Asthma What this  is Nasopharyngitis is a viral upper respiratory infection affecting the nose and throat. In people with asthma, colds can trigger coughing, chest tightness, or wheezing, even if asthma is usually well controlled. Common symptoms Runny or stuffy nose Sore throat Cough (often worse at night) Post-nasal drip Mild headache or body aches Possible asthma flare symptoms (wheezing, shortness of breath) Expected recovery Symptoms usually improve over 7-10 days. Cough and congestion may last up to 2-3 weeks. Asthma symptoms should improve as the infection resolves.  Medications & Treatments Guaifenesin  ER (Mucinex  D) Why it helps: Thins and loosens mucus so its easier to cough out; the decongestant helps relieve nasal congestion. How to take: Take as directed with a full glass of water. Swallow tablets whole. Common side effects: Nervousness, trouble sleeping, increased heart rate, nausea. Fluticasone  nasal spray (Flonase ) - every morning Why it helps: Reduces nasal inflammation, congestion, and post-nasal drip. How to use: One to two sprays in each nostril daily. Aim slightly outward, not toward the septum. Common side effects: Nasal irritation, nosebleeds, sore throat. Promethazine  DM - at night Why it helps: Suppresses cough and helps with sleep. How to take: Use only at bedtime due to drowsiness. Common side effects: Sleepiness, dizziness, dry mouth. Do not drive or drink alcohol while taking. Albuterol  inhaler - as needed Why it helps: Relaxes airway muscles to relieve wheezing, chest tightness, or shortness of breath. How to use: Use as prescribed for asthma symptoms. Common side effects: Tremor, jitteriness, fast heartbeat.  Supportive Care Steam showers to help open nasal passages Warm tea with honey to soothe throat and cough Stay well hydrated Rest as much as possible Avoid asthma triggers (smoke, strong odors, cold air)  When to Seek Medical Care Symptoms lasting longer than  10-14 days without improvement Fever over 101F or worsening sinus pain Increasing cough, wheezing, or need for albuterol  more often than usual Thick yellow/green nasal drainage with facial pain Go to the Emergency Department if: Severe shortness of breath or trouble speaking in full sentences Albuterol  not relieving breathing symptoms Chest retractions, bluish lips or fingernails Confusion, severe weakness, or fainting If you have asthma, monitor your symptoms closely and use your inhaler as directed while you recover.     ED Prescriptions     Medication Sig Dispense Auth. Provider   Pseudoephedrine -Guaifenesin  (MUCINEX  D MAX STRENGTH) [phone] MG TB12 Take 1 tablet by mouth 2 (two) times daily as needed. 20 tablet Leatrice Vernell HERO, NP   fluticasone  (FLONASE ) 50 MCG/ACT nasal spray Place 1 spray into both nostrils daily. 11.1 g Leatrice Vernell HERO, NP   promethazine -dextromethorphan (PROMETHAZINE -DM) 6.25-15 MG/5ML syrup Take 5 mLs by mouth at bedtime as needed for cough. 118 mL Leatrice Vernell HERO, NP   albuterol  (PROVENTIL ) (2.5 MG/3ML) 0.083% nebulizer solution Take 3 mLs (2.5 mg total) by nebulization every 6 (six) hours as needed for wheezing or shortness of breath. 75 mL Leatrice Vernell HERO, NP   albuterol  (VENTOLIN  HFA) 108 (90 Base) MCG/ACT inhaler Inhale 1-2 puffs into the lungs every 6 (six) hours as needed for wheezing or shortness of breath. 18 g Leatrice Vernell HERO, NP      PDMP not reviewed this encounter.    [1]  Social History Tobacco Use   Smoking status: Never    Passive exposure: Yes   Smokeless  tobacco: Never  Vaping Use   Vaping status: Former  Substance Use Topics   Alcohol use: Not Currently   Drug use: Not Currently    Types: Marijuana    Comment: + 01/08/23     Leatrice Vernell HERO, NP 11/15/24 [phone]  "

## 2024-11-15 NOTE — Discharge Instructions (Addendum)
 Diagnosis: Nasopharyngitis (Common Cold) with Asthma What this is Nasopharyngitis is a viral upper respiratory infection affecting the nose and throat. In people with asthma, colds can trigger coughing, chest tightness, or wheezing, even if asthma is usually well controlled. Common symptoms Runny or stuffy nose Sore throat Cough (often worse at night) Post-nasal drip Mild headache or body aches Possible asthma flare symptoms (wheezing, shortness of breath) Expected recovery Symptoms usually improve over 7-10 days. Cough and congestion may last up to 2-3 weeks. Asthma symptoms should improve as the infection resolves.  Medications & Treatments Guaifenesin  ER (Mucinex  D) Why it helps: Thins and loosens mucus so its easier to cough out; the decongestant helps relieve nasal congestion. How to take: Take as directed with a full glass of water. Swallow tablets whole. Common side effects: Nervousness, trouble sleeping, increased heart rate, nausea. Fluticasone  nasal spray (Flonase ) - every morning Why it helps: Reduces nasal inflammation, congestion, and post-nasal drip. How to use: One to two sprays in each nostril daily. Aim slightly outward, not toward the septum. Common side effects: Nasal irritation, nosebleeds, sore throat. Promethazine  DM - at night Why it helps: Suppresses cough and helps with sleep. How to take: Use only at bedtime due to drowsiness. Common side effects: Sleepiness, dizziness, dry mouth. Do not drive or drink alcohol while taking. Albuterol  inhaler - as needed Why it helps: Relaxes airway muscles to relieve wheezing, chest tightness, or shortness of breath. How to use: Use as prescribed for asthma symptoms. Common side effects: Tremor, jitteriness, fast heartbeat.  Supportive Care Steam showers to help open nasal passages Warm tea with honey to soothe throat and cough Stay well hydrated Rest as much as possible Avoid asthma triggers (smoke, strong odors, cold  air)  When to Seek Medical Care Symptoms lasting longer than 10-14 days without improvement Fever over 101F or worsening sinus pain Increasing cough, wheezing, or need for albuterol  more often than usual Thick yellow/green nasal drainage with facial pain Go to the Emergency Department if: Severe shortness of breath or trouble speaking in full sentences Albuterol  not relieving breathing symptoms Chest retractions, bluish lips or fingernails Confusion, severe weakness, or fainting If you have asthma, monitor your symptoms closely and use your inhaler as directed while you recover.
